# Patient Record
Sex: Male | Born: 1965
Health system: Southern US, Community
[De-identification: ages and names within clinical notes are randomized; demographics above are authoritative.]

## PROBLEM LIST (undated history)

## (undated) DIAGNOSIS — E119 Type 2 diabetes mellitus without complications: Secondary | ICD-10-CM

## (undated) DIAGNOSIS — I10 Essential (primary) hypertension: Secondary | ICD-10-CM

## (undated) HISTORY — PX: OTHER SURGICAL HISTORY: SHX169

## (undated) HISTORY — PX: APPENDECTOMY: SHX54

---

## 2017-02-08 ENCOUNTER — Ambulatory Visit (HOSPITAL_COMMUNITY)
Admission: EM | Admit: 2017-02-08 | Discharge: 2017-02-08 | Disposition: A | Discharge status: Home / Self Care | Payer: Self-pay | Attending: Internal Medicine | Admitting: Internal Medicine

## 2017-02-08 ENCOUNTER — Encounter (HOSPITAL_COMMUNITY): Payer: Self-pay | Admitting: Emergency Medicine

## 2017-02-08 DIAGNOSIS — R030 Elevated blood-pressure reading, without diagnosis of hypertension: Secondary | ICD-10-CM

## 2017-02-08 DIAGNOSIS — H9313 Tinnitus, bilateral: Secondary | ICD-10-CM

## 2017-02-08 DIAGNOSIS — R531 Weakness: Secondary | ICD-10-CM

## 2017-02-08 DIAGNOSIS — H60321 Hemorrhagic otitis externa, right ear: Secondary | ICD-10-CM

## 2017-02-08 DIAGNOSIS — R202 Paresthesia of skin: Secondary | ICD-10-CM

## 2017-02-08 HISTORY — DX: Type 2 diabetes mellitus without complications: E11.9

## 2017-02-08 HISTORY — DX: Essential (primary) hypertension: I10

## 2017-02-08 MED ORDER — CLONIDINE HCL 0.1 MG PO TABS
0.1000 mg | ORAL_TABLET | Freq: Once | ORAL | Status: AC
  Administered 2017-02-08: 0.1 mg via ORAL

## 2017-02-08 MED ORDER — CLONIDINE HCL 0.1 MG PO TABS
ORAL_TABLET | ORAL | Status: AC
  Filled 2017-02-08: qty 1

## 2017-02-08 MED ORDER — TRIAMTERENE-HCTZ 37.5-25 MG PO CAPS: 1.0000 | ORAL_CAPSULE | Freq: Every day | ORAL | 0 refills | Status: DC

## 2017-02-08 MED ORDER — NEOMYCIN-POLYMYXIN-HC 3.5-10000-1 OT SUSP: 3.0000 [drp] | OTIC | 0 refills | Status: DC

## 2017-02-08 NOTE — ED Triage Notes (Signed)
Notified dr Dayton Scrapemurray of patient complains and initial blood pressure readings

## 2017-02-08 NOTE — Discharge Instructions (Addendum)
Persistent numbness and tingling in left arm could be due to nerve compression at the shoulder or the upper spine, or the result of small stroke.  A primary care provider can help decide if advanced imaging would be helpful. Controlling your blood pressure may help prevent strokes. A prescription for triamterene/HCTZ, a blood pressure medicine, was sent to the pharmacy.  Controlling your blood pressure may also help with ringing in the ears. A prescription for Cortisporin otic, and eardrops, was sent to the pharmacy, to help with right outer ear infection. If ringing in the ears continues, or blood on the Q-tips continues, may need to follow-up with ENT.
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
## 2017-02-08 NOTE — ED Triage Notes (Signed)
Ringing in both ears for 2-3 weeks.   Left arm numbness for 4 weeks.  Numbness, tingling in left arm including left hand.  Weakness in left leg for 4 weeks too.    Patient went to acupuncture for 2 different episodes since onset

## 2017-02-15 ENCOUNTER — Ambulatory Visit: Payer: Self-pay | Attending: Internal Medicine | Admitting: Physician Assistant

## 2017-02-15 VITALS — BP 150/114 | HR 97 | Temp 98.2°F | Resp 20 | Ht 71.0 in | Wt 232.4 lb

## 2017-02-15 DIAGNOSIS — E118 Type 2 diabetes mellitus with unspecified complications: Secondary | ICD-10-CM

## 2017-02-15 DIAGNOSIS — F17210 Nicotine dependence, cigarettes, uncomplicated: Secondary | ICD-10-CM | POA: Insufficient documentation

## 2017-02-15 DIAGNOSIS — Z79899 Other long term (current) drug therapy: Secondary | ICD-10-CM | POA: Insufficient documentation

## 2017-02-15 DIAGNOSIS — Z880 Allergy status to penicillin: Secondary | ICD-10-CM | POA: Insufficient documentation

## 2017-02-15 DIAGNOSIS — R202 Paresthesia of skin: Secondary | ICD-10-CM

## 2017-02-15 DIAGNOSIS — I10 Essential (primary) hypertension: Secondary | ICD-10-CM

## 2017-02-15 DIAGNOSIS — Z7984 Long term (current) use of oral hypoglycemic drugs: Secondary | ICD-10-CM | POA: Insufficient documentation

## 2017-02-15 DIAGNOSIS — F1910 Other psychoactive substance abuse, uncomplicated: Secondary | ICD-10-CM

## 2017-02-15 LAB — POCT GLYCOSYLATED HEMOGLOBIN (HGB A1C): HEMOGLOBIN A1C: 6.2

## 2017-02-15 LAB — GLUCOSE, POCT (MANUAL RESULT ENTRY): POC GLUCOSE: 156 mg/dL — AB (ref 70–99)

## 2017-02-15 MED ORDER — METFORMIN HCL 500 MG PO TABS: 500.0000 mg | ORAL_TABLET | Freq: Two times a day (BID) | ORAL | 3 refills | Status: DC

## 2017-02-15 MED ORDER — LISINOPRIL-HYDROCHLOROTHIAZIDE 20-25 MG PO TABS: 1.0000 | ORAL_TABLET | Freq: Every day | ORAL | 3 refills | Status: DC

## 2017-02-15 MED FILL — LISINOPRIL-HCTZ 20-25 MG TA: 20-25 | 30 days supply | Qty: 30 | Fill #0

## 2017-02-15 MED FILL — ?METFORMIN HCL 500MG TABLET: 500 | 30 days supply | Qty: 60 | Fill #0

## 2017-02-15 NOTE — Progress Notes (Signed)
Patient ID: Antonio Pena, male   DOB: 09/12/1965, 51 y.o.   MRN: [id number] A  Antonio Pena, is a 51 y.o. male  [id number]  [id number]  DOB - 07/14/1965  Subjective:  Chief Complaint and HPI: Antonio Pena is a 51 y.o. male here today to establish care and for a follow up visit  after being seen in the Urgent Care 02/08/2017 c/o tinnitus and numbess and discomfort in his L arm.  BP was very high.  Clonidine was given and he was discharged with a prescription of Triamterene/HCTZ.  He was found to have otitis externa and prescribed Cortisporin otic drops.  Today, he presents with a 6 week h/o L arm/Lneck pain/paresthesias.  He does a lot of heavy lifting.  NKI.  He denies CP/SOB/Dizziness/HAs.  No weakness of L arm.  No FH early cardiac events.    His partner is with him and has been checking his blood sugars and they have been ranging from 140-270.  He denies polyuria/polydipsia.  He feels that these numbers are good because "it used to be up in the 300s."  He admits to not having been to a doctor in years.  He uses ~$2000.00/week in cocaine and has been getting opiates from friends to take for his arm pain.  The arm pain is worse at rest.  He was clean/sober in [place] many years ago for about 4 years but has never been back and has no desire to get clean again.  He is happy with his current life and drug use.     Per UC note: Blood pressure is markedly elevated, and the patient acknowledges cocaine use. He is on pain medicine. He is smoking 3 packs a day and this is a reduction from previous.  ED/Hospital notes reviewed.   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    ROS:   Constitutional:  No f/c, No night sweats, No unexplained weight loss. EENT:  No vision changes, No blurry vision, No hearing changes. No mouth, throat, or ear problems.  Respiratory: No cough, No SOB Cardiac: No CP, no palpitations GI:  No abd pain, No N/V/D. GU: No Urinary s/sx Musculoskeletal: No  joint pain Neuro: No headache, no dizziness, no motor weakness.  Skin: No rash Endocrine:  No polydipsia. No polyuria.  Psych: Denies SI/HI  No problems updated.  ALLERGIES: Allergies  Allergen Reactions  . Amoxicillin     PAST MEDICAL HISTORY: Past Medical History:  Diagnosis Date  . Diabetes mellitus without complication (HCC)   . Hypertension     MEDICATIONS AT HOME: Prior to Admission medications   Medication Sig Start Date End Date Taking? Authorizing Provider  neomycin-polymyxin-hydrocortisone (CORTISPORIN) 3.5-10000-1 OTIC suspension Place 3 drops into the right ear every 4 (four) hours. 02/08/17  Yes Eustace MooreMurray, Laura W, MD  Oxycodone-Acetaminophen (PERCOCET PO) Take by mouth.   Yes [provider]  HYDROcodone-acetaminophen (NORCO) 7.5-325 MG tablet Take 1 tablet by mouth every 6 (six) hours as needed for moderate pain.    [provider]  lisinopril-hydrochlorothiazide (PRINZIDE,ZESTORETIC) 20-25 MG tablet Take 1 tablet by mouth daily. 02/15/17   Anders SimmondsMcClung, Dotty Gonzalo M, PA-C  metFORMIN (GLUCOPHAGE) 500 MG tablet Take 1 tablet (500 mg total) by mouth 2 (two) times daily with a meal. 02/15/17   Anders SimmondsMcClung, Alanya Vukelich M, PA-C     Objective:  EXAM:   Vitals:   02/15/17 1037 02/15/17 1117  BP: (!) 157/109 (!) 150/114  Pulse: 97   Resp: 20   Temp:  98.2 F (36.8 C)   TempSrc: Oral   SpO2: 96%   Weight: 232 lb 6.4 oz (105.4 kg)   Height: 5\' 11"  (1.803 m)     General appearance : A&OX3. NAD. Non-toxic-appearing HEENT: Atraumatic and Normocephalic.  PERRLA. EOM intact.  TM clear B. Mouth-MMM, post pharynx WNL w/o erythema, No PND. Neck: supple, no JVD. No cervical lymphadenopathy. No thyromegaly Chest/Lungs:  Breathing-non-labored, Good air entry bilaterally, breath sounds normal without rales, rhonchi, or wheezing  CVS: S1 S2 regular, no murmurs, gallops, rubs  Extremities: Bilateral Lower Ext shows no edema, both legs are warm to touch with = pulse  throughout Neurology:  CN II-XII grossly intact, Non focal.   Psych:  TP linear. Normal speech. Appropriate eye contact and affect.  Skin:  No Rash  Data Review No results found for: HGBA1C   Assessment & Plan   1. Type 2 diabetes mellitus with complication, without long-term current use of insulin (HCC) New diagnosis - Glucose (CBG) - HgB A1c=6.2% today - metFORMIN (GLUCOPHAGE) 500 MG tablet; Take 1 tablet (500 mg total) by mouth 2 (two) times daily with a meal.  Dispense: 180 tablet; Refill: 3 I have had a lengthy discussion and provided education about insulin resistance and the intake of too much sugar/refined carbohydrates.  I have advised the patient to work at a goal of eliminating sugary drinks, candy, desserts, sweets, refined sugars, processed foods, and white carbohydrates.  The patient expresses understanding.    2. Hypertension, unspecified type Stop dyazide.  Given that he is also diabetic and has no desire to change lifestyle/work on diet/sobriety, etc, I will start- - lisinopril-hydrochlorothiazide (PRINZIDE,ZESTORETIC) 20-25 MG tablet; Take 1 tablet by mouth daily.  Dispense: 90 tablet; Refill: 3 _check CMP, CBC  3. Polysubstance abuse I have counseled the patient at length about substance abuse and addiction.  12 step meetings/recovery recommended.  Local 12 step meeting lists were given and attendance was encouraged.  Patient expresses understanding.    4. Paresthesia of left arm - DG Cervical Spine Complete; Future -check TSH  5. 3ppd smoker-cessation advised and resources for support offered.    I spent >50mins face to face with patient and his partner explaining the risks of continued drug use including multi-co morbidities and even death should he continue.  He states that he understands this.  Patient have been counseled extensively about nutrition and exercise  Return in about 3 weeks (around 03/08/2017) for assign PCP; f/up DM and htn.  The patient was  given clear instructions to go to ER or return to medical center if symptoms don't improve, worsen or new problems develop. The patient verbalized understanding. The patient was told to call to get lab results if they haven't heard anything in the next week.     Georgian Co, PA-C [facility] and [facility] [place], [place] [phone]   02/15/2017, 11:18 AM

## 2017-02-15 NOTE — Patient Instructions (Signed)
Check blood pressure daily and record and bring to next office visit. Check fasting blood sugar daily and record and bring to next visit.

## 2017-02-15 NOTE — Progress Notes (Signed)
Pain:  Left arm- numbness 5-6 week Right leg numbness, intermittent Constant ringing in head  F/u elevated BP

## 2017-02-16 ENCOUNTER — Ambulatory Visit (HOSPITAL_COMMUNITY)
Admission: RE | Admit: 2017-02-16 | Discharge: 2017-02-16 | Disposition: A | Discharge status: Home / Self Care | Payer: Self-pay | Source: Ambulatory Visit | Attending: Physician Assistant | Admitting: Physician Assistant

## 2017-02-16 ENCOUNTER — Telehealth: Payer: Self-pay | Admitting: *Deleted

## 2017-02-16 DIAGNOSIS — M47812 Spondylosis without myelopathy or radiculopathy, cervical region: Secondary | ICD-10-CM | POA: Insufficient documentation

## 2017-02-16 DIAGNOSIS — R202 Paresthesia of skin: Secondary | ICD-10-CM

## 2017-02-16 LAB — CBC WITH DIFFERENTIAL/PLATELET
BASOS ABS: 0 10*3/uL (ref 0.0–0.2)
Basos: 1 %
EOS (ABSOLUTE): 0.2 10*3/uL (ref 0.0–0.4)
Eos: 3 %
Hematocrit: 53.4 % — ABNORMAL HIGH (ref 37.5–51.0)
Hemoglobin: 17.9 g/dL — ABNORMAL HIGH (ref 13.0–17.7)
Immature Grans (Abs): 0 10*3/uL (ref 0.0–0.1)
Immature Granulocytes: 0 %
LYMPHS ABS: 2.7 10*3/uL (ref 0.7–3.1)
Lymphs: 37 %
MCH: 30.4 pg (ref 26.6–33.0)
MCHC: 33.5 g/dL (ref 31.5–35.7)
MCV: 91 fL (ref 79–97)
Monocytes Absolute: 0.6 10*3/uL (ref 0.1–0.9)
Monocytes: 8 %
NEUTROS ABS: 3.7 10*3/uL (ref 1.4–7.0)
Neutrophils: 51 %
PLATELETS: 255 10*3/uL (ref 150–379)
RBC: 5.89 x10E6/uL — AB (ref 4.14–5.80)
RDW: 13.6 % (ref 12.3–15.4)
WBC: 7.2 10*3/uL (ref 3.4–10.8)

## 2017-02-16 LAB — COMPREHENSIVE METABOLIC PANEL
ALBUMIN: 4.6 g/dL (ref 3.5–5.5)
ALK PHOS: 130 IU/L — AB (ref 39–117)
ALT: 37 IU/L (ref 0–44)
AST: 18 IU/L (ref 0–40)
Albumin/Globulin Ratio: 1.7 (ref 1.2–2.2)
BILIRUBIN TOTAL: 0.3 mg/dL (ref 0.0–1.2)
BUN / CREAT RATIO: 18 (ref 9–20)
BUN: 18 mg/dL (ref 6–24)
CHLORIDE: 97 mmol/L (ref 96–106)
CO2: 26 mmol/L (ref 20–29)
Calcium: 9.9 mg/dL (ref 8.7–10.2)
Creatinine, Ser: 1.01 mg/dL (ref 0.76–1.27)
GFR calc non Af Amer: 86 mL/min/{1.73_m2} (ref >59)
GFR, EST AFRICAN AMERICAN: 100 mL/min/{1.73_m2} (ref >59)
GLUCOSE: 138 mg/dL — AB (ref 65–99)
Globulin, Total: 2.7 g/dL (ref 1.5–4.5)
POTASSIUM: 5.6 mmol/L — AB (ref 3.5–5.2)
Sodium: 137 mmol/L (ref 134–144)
TOTAL PROTEIN: 7.3 g/dL (ref 6.0–8.5)

## 2017-02-16 LAB — TSH: TSH: 2.25 u[IU]/mL (ref 0.450–4.500)

## 2017-02-16 NOTE — Telephone Encounter (Signed)
Status:  Final result  Visible to patient:  No (Not Released)  Dx:  Paresthesia of left arm  Notes recorded by Anders SimmondsMcClung, Angela M, PA-C on 02/16/2017 at 1:43 PM EDT Please call patient and let him know that his xray showed some arthritis and disc narrowing but no dangerous problems. I suspect his pain will improve with rest and no heavy lifting for a bit. Follow-up as planned.   No answer.

## 2017-02-22 NOTE — Telephone Encounter (Signed)
Pt aware of results. He states pain has not improved. He attempted to get a handle on pain medication in "his way" with street medication. He c/o pain in back and shoulder. He has an appointment on Wednesday 02/24/17. Encouraged to request further imaging and referral to pain specialist.

## 2017-02-24 ENCOUNTER — Ambulatory Visit: Payer: Self-pay | Attending: Internal Medicine | Admitting: Physician Assistant

## 2017-02-24 ENCOUNTER — Other Ambulatory Visit: Payer: Self-pay

## 2017-02-24 ENCOUNTER — Encounter: Payer: Self-pay | Admitting: Physician Assistant

## 2017-02-24 VITALS — BP 141/91 | HR 88 | Temp 98.1°F | Ht 71.0 in | Wt 228.6 lb

## 2017-02-24 DIAGNOSIS — F1410 Cocaine abuse, uncomplicated: Secondary | ICD-10-CM | POA: Insufficient documentation

## 2017-02-24 DIAGNOSIS — R079 Chest pain, unspecified: Secondary | ICD-10-CM

## 2017-02-24 DIAGNOSIS — F17210 Nicotine dependence, cigarettes, uncomplicated: Secondary | ICD-10-CM | POA: Insufficient documentation

## 2017-02-24 DIAGNOSIS — F1910 Other psychoactive substance abuse, uncomplicated: Secondary | ICD-10-CM

## 2017-02-24 DIAGNOSIS — M79602 Pain in left arm: Secondary | ICD-10-CM

## 2017-02-24 DIAGNOSIS — I10 Essential (primary) hypertension: Secondary | ICD-10-CM

## 2017-02-24 DIAGNOSIS — Z880 Allergy status to penicillin: Secondary | ICD-10-CM | POA: Insufficient documentation

## 2017-02-24 DIAGNOSIS — E118 Type 2 diabetes mellitus with unspecified complications: Secondary | ICD-10-CM

## 2017-02-24 DIAGNOSIS — Z7984 Long term (current) use of oral hypoglycemic drugs: Secondary | ICD-10-CM | POA: Insufficient documentation

## 2017-02-24 DIAGNOSIS — F1110 Opioid abuse, uncomplicated: Secondary | ICD-10-CM | POA: Insufficient documentation

## 2017-02-24 LAB — GLUCOSE, POCT (MANUAL RESULT ENTRY): POC GLUCOSE: 153 mg/dL — AB (ref 70–99)

## 2017-02-24 MED ORDER — DICLOFENAC SODIUM 1 % TD GEL: 4.0000 g | Freq: Four times a day (QID) | TRANSDERMAL | 4 refills | Status: DC

## 2017-02-24 NOTE — Progress Notes (Signed)
Antonio Pena, is a 51 y.o. male  NWG:[id number]  [id number]:[id number]  DOB - July 24, 1965  Subjective:  Chief Complaint and HPI: Antonio Pena is a 51 y.o. male here with continued L arm pain X 6 weeks. (see last visit) Xrays showed arthritic changes with no impingement.  His whole arm hurts.  He continues to take opiates-about 4-6 oxycodone and 6-8 hydrocodone daily that he is buying off of the streets.  He also continues to do ~$2000.00 cocaine/week.  He is requesting something for pain management.  No weakness.  No atrophy.  No change in grip.  He is R hand dominant.  He also admits to occasional CP at work.  He usu sits down and smokes a couple of cigarettes when it occurs and this seems to help. New onset of ED.  Difficult to maintain an erection.  ROS:   Constitutional:  No f/c, No night sweats, No unexplained weight loss. EENT:  No vision changes, No blurry vision, No hearing changes. No mouth, throat, or ear problems.  Respiratory: No cough, No SOB Cardiac: + CP, no palpitations GI:  No abd pain, No N/V/D. GU: No Urinary s/sx Musculoskeletal: L arm pain Neuro: No headache, no dizziness, no motor weakness.  Skin: No rash Endocrine:  No polydipsia. No polyuria.  Psych: Denies SI/HI  Problem  Htn (Hypertension)  Substance Abuse    ALLERGIES: Allergies  Allergen Reactions  . Amoxicillin     PAST MEDICAL HISTORY: Past Medical History:  Diagnosis Date  . Diabetes mellitus without complication (HCC)   . Hypertension     MEDICATIONS AT HOME: Prior to Admission medications   Medication Sig Start Date End Date Taking? Authorizing Provider  diclofenac sodium (VOLTAREN) 1 % GEL Apply 4 g topically 4 (four) times daily. 02/24/17  Yes Anders Simmonds, PA-C  HYDROcodone-acetaminophen (NORCO) 7.5-325 MG tablet Take 1 tablet by mouth every 6 (six) hours as needed for moderate pain.   Yes [provider]  lisinopril-hydrochlorothiazide (PRINZIDE,ZESTORETIC) 20-25 MG  tablet Take 1 tablet by mouth daily. 02/15/17  Yes Georgian Co M, PA-C  metFORMIN (GLUCOPHAGE) 500 MG tablet Take 1 tablet (500 mg total) by mouth 2 (two) times daily with a meal. 02/15/17  Yes McClung, Angela M, PA-C  neomycin-polymyxin-hydrocortisone (CORTISPORIN) 3.5-10000-1 OTIC suspension Place 3 drops into the right ear every 4 (four) hours. 02/08/17  Yes Eustace Moore, MD  Oxycodone-Acetaminophen (PERCOCET PO) Take by mouth.   Yes [provider]     Objective:  EXAM:   Vitals:   02/24/17 0952  BP: (!) 141/91  Pulse: 88  Temp: 98.1 F (36.7 C)  TempSrc: Oral  SpO2: 97%  Weight: 228 lb 9.6 oz (103.7 kg)  Height: 5\' 11"  (1.803 m)    General appearance : A&OX3. NAD. Non-toxic-appearing HEENT: Atraumatic and Normocephalic.  PERRLA. EOM intact. Neck: supple, no JVD. No cervical lymphadenopathy. No thyromegaly Chest/Lungs:  Breathing-non-labored, Good air entry bilaterally, breath sounds normal without rales, rhonchi, or wheezing  CVS: S1 S2 regular, no murmurs, gallops, rubs  Extremities: Bilateral Lower Ext shows no edema, both legs are warm to touch with = pulse throughout. UE full S&ROM B with normal grip/bicep/tricep/forearm strength.  No muscle atrophy.  DTR=B Neurology:  CN II-XII grossly intact, Non focal.   Psych:  TP linear. J/I WNL. Normal speech. Appropriate eye contact and affect.  Skin:  No Rash  Data Review Lab Results  Component Value Date   HGBA1C 6.2 02/15/2017   EKG w/o ischemia and  reviewed with Dr. Hyman Hopes.    Assessment & Plan   1. Chest pain, unspecified type He has so many dangerous risk factors.  Cocaine use, htn, DM, heavy smoking that it is imperative he quit using substances.  I have implored with him at length the risks assc with continued use, smoking and comorbidities including, CVA event, death, etc.   2. Type 2 diabetes mellitus with complication, without long-term current use of insulin (HCC) Improving.  Continue metformin -  Glucose (CBG)  3. Left arm pain - diclofenac sodium (VOLTAREN) 1 % GEL; Apply 4 g topically 4 (four) times daily.  Dispense: 100 g; Refill: 4  4. Hypertension, unspecified type Improving.  Continue current medication regimen. Cocaine and smoking cessation imperative.  5. Substance abuse I have counseled the patient at length about substance abuse and addiction.  12 step meetings/recovery recommended.  Local 12 step meeting lists were given and attendance was encouraged.  Patient expresses understanding. I have offered to help assist him getting into treatment but he is not interested.    Patient have been counseled extensively about nutrition and exercise  Return in about 3 weeks (around 03/18/2017) for keep appt with Dr Laural Benes to establish care.  The patient was given clear instructions to go to ER or return to medical center if symptoms don't improve, worsen or new problems develop. The patient verbalized understanding. The patient was told to call to get lab results if they haven't heard anything in the next week.     Georgian Co, PA-C [facility] and [facility] [place], [place] [phone]   02/24/2017, 10:10 AM

## 2017-03-18 ENCOUNTER — Encounter: Payer: Self-pay | Admitting: Internal Medicine

## 2017-03-18 ENCOUNTER — Ambulatory Visit: Payer: Self-pay | Attending: Internal Medicine | Admitting: Internal Medicine

## 2017-03-18 VITALS — BP 112/72 | HR 99 | Temp 98.5°F | Resp 18 | Ht 71.0 in | Wt 232.4 lb

## 2017-03-18 DIAGNOSIS — F1110 Opioid abuse, uncomplicated: Secondary | ICD-10-CM | POA: Insufficient documentation

## 2017-03-18 DIAGNOSIS — F1410 Cocaine abuse, uncomplicated: Secondary | ICD-10-CM | POA: Insufficient documentation

## 2017-03-18 DIAGNOSIS — R200 Anesthesia of skin: Secondary | ICD-10-CM | POA: Insufficient documentation

## 2017-03-18 DIAGNOSIS — F17200 Nicotine dependence, unspecified, uncomplicated: Secondary | ICD-10-CM

## 2017-03-18 DIAGNOSIS — E119 Type 2 diabetes mellitus without complications: Secondary | ICD-10-CM | POA: Insufficient documentation

## 2017-03-18 DIAGNOSIS — Z79899 Other long term (current) drug therapy: Secondary | ICD-10-CM | POA: Insufficient documentation

## 2017-03-18 DIAGNOSIS — I10 Essential (primary) hypertension: Secondary | ICD-10-CM | POA: Insufficient documentation

## 2017-03-18 DIAGNOSIS — M542 Cervicalgia: Secondary | ICD-10-CM

## 2017-03-18 DIAGNOSIS — Z880 Allergy status to penicillin: Secondary | ICD-10-CM | POA: Insufficient documentation

## 2017-03-18 DIAGNOSIS — N529 Male erectile dysfunction, unspecified: Secondary | ICD-10-CM | POA: Insufficient documentation

## 2017-03-18 DIAGNOSIS — F1910 Other psychoactive substance abuse, uncomplicated: Secondary | ICD-10-CM

## 2017-03-18 DIAGNOSIS — Z2821 Immunization not carried out because of patient refusal: Secondary | ICD-10-CM

## 2017-03-18 DIAGNOSIS — Z7984 Long term (current) use of oral hypoglycemic drugs: Secondary | ICD-10-CM | POA: Insufficient documentation

## 2017-03-18 DIAGNOSIS — M5412 Radiculopathy, cervical region: Secondary | ICD-10-CM | POA: Insufficient documentation

## 2017-03-18 DIAGNOSIS — E118 Type 2 diabetes mellitus with unspecified complications: Secondary | ICD-10-CM

## 2017-03-18 DIAGNOSIS — F17210 Nicotine dependence, cigarettes, uncomplicated: Secondary | ICD-10-CM | POA: Insufficient documentation

## 2017-03-18 LAB — GLUCOSE, POCT (MANUAL RESULT ENTRY): POC Glucose: 134 mg/dl — AB (ref 70–99)

## 2017-03-18 MED ORDER — GABAPENTIN 300 MG PO CAPS: ORAL_CAPSULE | ORAL | 1 refills | Status: DC

## 2017-03-18 MED FILL — LISINOPRIL-HCTZ 20-25 MG TA: 20-25 | 30 days supply | Qty: 30 | Fill #1

## 2017-03-18 MED FILL — VOLTAREN 1% GEL: 1 | 6 days supply | Qty: 100 | Fill #0

## 2017-03-18 MED FILL — GABAPENTIN 300 MG CAPSULE: 300 | 30 days supply | Qty: 60 | Fill #0

## 2017-03-18 MED FILL — metFORMIN HCL 500 MG TABS: 500 | 30 days supply | Qty: 60 | Fill #1
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
## 2017-03-18 NOTE — Patient Instructions (Signed)
Start Gabapentin 300 mg at bedtime for 1 week then increase to twice a day.  This medication can cause drowsiness.

## 2017-03-19 ENCOUNTER — Other Ambulatory Visit: Payer: Self-pay | Admitting: Internal Medicine

## 2017-03-19 LAB — LYME AB/WESTERN BLOT REFLEX
LYME DISEASE AB, QUANT, IGM: <0.8 index (ref 0.00–0.79)
Lyme IgG/IgM Ab: <0.91 {ISR} (ref 0.00–0.90)

## 2017-03-19 LAB — CREATININE, SERUM
Creatinine, Ser: 1.13 mg/dL (ref 0.76–1.27)
GFR calc non Af Amer: 75 mL/min/{1.73_m2} (ref >59)
GFR, EST AFRICAN AMERICAN: 87 mL/min/{1.73_m2} (ref >59)

## 2017-03-19 LAB — SEDIMENTATION RATE: Sed Rate: 6 mm/hr (ref 0–30)

## 2017-03-19 LAB — POTASSIUM: POTASSIUM: 5.3 mmol/L — AB (ref 3.5–5.2)

## 2017-03-19 LAB — VITAMIN B12: VITAMIN B 12: 306 pg/mL (ref 232–1245)

## 2017-03-19 MED ORDER — HYDROCHLOROTHIAZIDE 12.5 MG PO CAPS: 12.5000 mg | ORAL_CAPSULE | Freq: Every day | ORAL | 1 refills | Status: DC

## 2017-03-19 MED ORDER — AMLODIPINE BESYLATE 5 MG PO TABS: 5.0000 mg | ORAL_TABLET | Freq: Every day | ORAL | 3 refills | Status: DC

## 2017-03-23 ENCOUNTER — Telehealth: Payer: Self-pay

## 2017-03-23 NOTE — Telephone Encounter (Signed)
Contacted pt to go over lab results pt is aware and doesn't have any questions or concerns 

## 2017-03-24 ENCOUNTER — Other Ambulatory Visit: Payer: Self-pay

## 2017-03-24 MED ORDER — AMLODIPINE BESYLATE 5 MG PO TABS: 5.0000 mg | ORAL_TABLET | Freq: Every day | ORAL | 3 refills | Status: DC

## 2017-03-24 MED ORDER — HYDROCHLOROTHIAZIDE 12.5 MG PO CAPS: 12.5000 mg | ORAL_CAPSULE | Freq: Every day | ORAL | 1 refills | Status: DC

## 2017-03-29 ENCOUNTER — Ambulatory Visit (HOSPITAL_COMMUNITY): Admission: RE | Admit: 2017-03-29 | Payer: Self-pay | Source: Ambulatory Visit

## 2017-04-12 ENCOUNTER — Encounter: Payer: Self-pay | Admitting: Neurology

## 2017-04-12 ENCOUNTER — Other Ambulatory Visit: Payer: Self-pay | Admitting: *Deleted

## 2017-04-12 DIAGNOSIS — R200 Anesthesia of skin: Secondary | ICD-10-CM

## 2017-04-12 DIAGNOSIS — R202 Paresthesia of skin: Principal | ICD-10-CM

## 2017-04-16 ENCOUNTER — Encounter: Payer: Self-pay | Admitting: Internal Medicine

## 2017-04-16 ENCOUNTER — Ambulatory Visit: Payer: Self-pay | Attending: Internal Medicine | Admitting: Internal Medicine

## 2017-04-16 VITALS — BP 124/85 | HR 94 | Temp 98.3°F | Resp 16 | Wt 225.6 lb

## 2017-04-16 DIAGNOSIS — E875 Hyperkalemia: Secondary | ICD-10-CM | POA: Insufficient documentation

## 2017-04-16 DIAGNOSIS — Z79891 Long term (current) use of opiate analgesic: Secondary | ICD-10-CM | POA: Insufficient documentation

## 2017-04-16 DIAGNOSIS — Z880 Allergy status to penicillin: Secondary | ICD-10-CM | POA: Insufficient documentation

## 2017-04-16 DIAGNOSIS — Z9889 Other specified postprocedural states: Secondary | ICD-10-CM | POA: Insufficient documentation

## 2017-04-16 DIAGNOSIS — F1410 Cocaine abuse, uncomplicated: Secondary | ICD-10-CM | POA: Insufficient documentation

## 2017-04-16 DIAGNOSIS — M5412 Radiculopathy, cervical region: Secondary | ICD-10-CM | POA: Insufficient documentation

## 2017-04-16 DIAGNOSIS — I10 Essential (primary) hypertension: Secondary | ICD-10-CM | POA: Insufficient documentation

## 2017-04-16 DIAGNOSIS — F17210 Nicotine dependence, cigarettes, uncomplicated: Secondary | ICD-10-CM | POA: Insufficient documentation

## 2017-04-16 DIAGNOSIS — Z7984 Long term (current) use of oral hypoglycemic drugs: Secondary | ICD-10-CM | POA: Insufficient documentation

## 2017-04-16 DIAGNOSIS — Z888 Allergy status to other drugs, medicaments and biological substances status: Secondary | ICD-10-CM | POA: Insufficient documentation

## 2017-04-16 DIAGNOSIS — F1910 Other psychoactive substance abuse, uncomplicated: Secondary | ICD-10-CM | POA: Insufficient documentation

## 2017-04-16 DIAGNOSIS — E118 Type 2 diabetes mellitus with unspecified complications: Secondary | ICD-10-CM | POA: Insufficient documentation

## 2017-04-16 DIAGNOSIS — Z79899 Other long term (current) drug therapy: Secondary | ICD-10-CM | POA: Insufficient documentation

## 2017-04-16 DIAGNOSIS — F1110 Opioid abuse, uncomplicated: Secondary | ICD-10-CM | POA: Insufficient documentation

## 2017-04-16 LAB — GLUCOSE, POCT (MANUAL RESULT ENTRY): POC GLUCOSE: 172 mg/dL — AB (ref 70–99)

## 2017-04-16 MED ORDER — GABAPENTIN 300 MG PO CAPS: ORAL_CAPSULE | ORAL | 1 refills | Status: DC

## 2017-04-16 MED ORDER — METFORMIN HCL 500 MG PO TABS: 500.0000 mg | ORAL_TABLET | Freq: Two times a day (BID) | ORAL | 3 refills | Status: DC

## 2017-04-16 MED ORDER — VITAMIN B-12 1000 MCG PO TABS: 1000.0000 ug | ORAL_TABLET | Freq: Every day | ORAL | 1 refills | Status: AC

## 2017-04-16 MED ORDER — AMLODIPINE BESYLATE 5 MG PO TABS: 5.0000 mg | ORAL_TABLET | Freq: Every day | ORAL | 3 refills | Status: DC

## 2017-04-16 MED FILL — GABAPENTIN 300 MG CAPSULE: 300 | 30 days supply | Qty: 60 | Fill #0

## 2017-04-16 MED FILL — ?METFORMIN HCL 500MG TABLET: 500 | 30 days supply | Qty: 60 | Fill #0
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
## 2017-04-16 NOTE — Patient Instructions (Signed)
Take the Norvasc 5 mg daily for blood pressure. Continue Metformin for diabetes.  Take Vitamin B12 for low level.

## 2017-04-20 ENCOUNTER — Encounter: Payer: Self-pay | Admitting: Neurology

## 2017-04-23 ENCOUNTER — Other Ambulatory Visit: Payer: Self-pay | Admitting: *Deleted

## 2017-04-23 DIAGNOSIS — R200 Anesthesia of skin: Secondary | ICD-10-CM

## 2017-05-04 ENCOUNTER — Encounter: Payer: Self-pay | Admitting: Neurology

## 2017-05-20 ENCOUNTER — Ambulatory Visit (HOSPITAL_COMMUNITY): Payer: Self-pay

## 2017-06-07 MED FILL — AMLODIPINE BESYLATE 5 MG TA: 5 | 30 days supply | Qty: 30 | Fill #0

## 2017-06-07 MED FILL — ?METFORMIN HCL 500MG TABLET: 500 | 30 days supply | Qty: 60 | Fill #1

## 2017-06-07 MED FILL — HYDROCHLOROTHIAZIDE 12.5 MG: 12.5 | 30 days supply | Qty: 30 | Fill #0

## 2017-06-10 ENCOUNTER — Ambulatory Visit (HOSPITAL_COMMUNITY): Payer: Self-pay

## 2017-07-13 ENCOUNTER — Ambulatory Visit (HOSPITAL_COMMUNITY)
Admission: RE | Admit: 2017-07-13 | Discharge: 2017-07-13 | Disposition: A | Discharge status: Home / Self Care | Payer: Self-pay | Source: Ambulatory Visit | Attending: Internal Medicine | Admitting: Internal Medicine

## 2017-07-13 DIAGNOSIS — M542 Cervicalgia: Secondary | ICD-10-CM

## 2017-07-20 ENCOUNTER — Encounter (HOSPITAL_COMMUNITY): Payer: Self-pay

## 2017-07-20 ENCOUNTER — Ambulatory Visit (HOSPITAL_COMMUNITY)
Admission: RE | Admit: 2017-07-20 | Discharge: 2017-07-20 | Disposition: A | Discharge status: Home / Self Care | Payer: Self-pay | Source: Ambulatory Visit | Attending: Internal Medicine | Admitting: Internal Medicine

## 2017-07-20 DIAGNOSIS — S0540XA Penetrating wound of orbit with or without foreign body, unspecified eye, initial encounter: Secondary | ICD-10-CM

## 2017-07-20 DIAGNOSIS — Z0389 Encounter for observation for other suspected diseases and conditions ruled out: Secondary | ICD-10-CM | POA: Insufficient documentation

## 2017-07-20 DIAGNOSIS — M50222 Other cervical disc displacement at C5-C6 level: Secondary | ICD-10-CM | POA: Insufficient documentation

## 2017-07-20 DIAGNOSIS — M542 Cervicalgia: Secondary | ICD-10-CM | POA: Insufficient documentation

## 2017-07-20 DIAGNOSIS — M4802 Spinal stenosis, cervical region: Secondary | ICD-10-CM | POA: Insufficient documentation

## 2017-07-20 LAB — CREATININE, SERUM: Creatinine, Ser: 0.98 mg/dL (ref 0.61–1.24)

## 2017-07-20 MED ORDER — GADOBENATE DIMEGLUMINE 529 MG/ML IV SOLN
20.0000 mL | Freq: Once | INTRAVENOUS | Status: AC | PRN
  Administered 2017-07-20: 20 mL via INTRAVENOUS

## 2017-07-21 ENCOUNTER — Other Ambulatory Visit: Payer: Self-pay | Admitting: Family Medicine

## 2017-07-21 ENCOUNTER — Telehealth: Payer: Self-pay | Admitting: Internal Medicine

## 2017-07-21 DIAGNOSIS — M5412 Radiculopathy, cervical region: Secondary | ICD-10-CM

## 2017-07-21 DIAGNOSIS — E118 Type 2 diabetes mellitus with unspecified complications: Secondary | ICD-10-CM

## 2017-07-21 DIAGNOSIS — I10 Essential (primary) hypertension: Secondary | ICD-10-CM

## 2017-07-21 DIAGNOSIS — G542 Cervical root disorders, not elsewhere classified: Secondary | ICD-10-CM

## 2017-07-21 MED ORDER — AMLODIPINE BESYLATE 5 MG PO TABS: 5.0000 mg | ORAL_TABLET | Freq: Every day | ORAL | 3 refills | Status: DC

## 2017-07-21 MED ORDER — GABAPENTIN 300 MG PO CAPS: ORAL_CAPSULE | ORAL | 1 refills | Status: DC

## 2017-07-21 MED ORDER — METFORMIN HCL 500 MG PO TABS: 500.0000 mg | ORAL_TABLET | Freq: Two times a day (BID) | ORAL | 3 refills | Status: DC

## 2017-07-21 NOTE — Telephone Encounter (Signed)
PC placed to pt today to g over results of MRI of C-spine.  Results copied below.  I advised pt to be seen in ER so that he can be seen by neurosurgeon.  Pt declined stating that he does not have insurance as has to pay as he goes.  MRI cost $1800 and he pay $1200 down and they will bill him for the rest. Still c/o of pain and numbness especially in LT arm.  PT informed he can loss function in one or both arms if he is not seen urgently. Pt expresses understanding but states he has been dealing with this for a while.  He plans to save up funds over the next 2-3 wks then will see a specialist. He does not want to apply for [facility] discount.  He is agreeable to me submitting referral to neurosurgeon.

## 2017-07-21 NOTE — Telephone Encounter (Signed)
Please call [phone]

## 2017-07-29 MED FILL — ?AMLODIPINE BESYLATE 5 MG T: 5 MG | 30 days supply | Qty: 30 | Fill #0

## 2017-07-29 MED FILL — ?METFORMIN HCL 500MG TABLET: 500 | 30 days supply | Qty: 60 | Fill #0

## 2017-07-29 MED FILL — GABAPENTIN 300 MG CAPSULE: 300 | 30 days supply | Qty: 60 | Fill #0

## 2017-08-11 ENCOUNTER — Telehealth: Payer: Self-pay | Admitting: Internal Medicine

## 2017-08-11 NOTE — Telephone Encounter (Signed)
-----   Message from Dionne BucyNora E Soler sent at 08/10/2017  4:03 PM EST ----- Dr Laural BenesJohnson  Patient is uninsured and we don't have Neurosurgeon with our programs  . mailed a letter with the options to go to [facility] $200 consultation or [facility]  [facility]150 waiting for the patient response . ----- Message ----- From: Marcine MatarJohnson, Tychelle Purkey B, MD Sent: 07/21/2017   1:31 PM To: Dionne BucyNora E Soler  Can you try to get him in with neurosurgeon as soon as possible but no earlier than 3 wks out?  Thanks

## 2017-08-11 NOTE — Telephone Encounter (Signed)
-----   Message from Dionne BucyNora E Soler sent at 08/10/2017  4:06 PM EST ----- Regarding: Neurosugeon  Referral  Please, disregard the previous message .   Sent Referral to [facility] ph. # [phone] .They will contact the patient to schedule an appointment and I will follow up.

## 2017-09-29 MED FILL — metFORMIN HCL 500 MG TABS: 500 | 30 days supply | Qty: 60 | Fill #1

## 2017-09-29 MED FILL — AMLODIPINE BESYLATE 5 MG TA: 5 | 30 days supply | Qty: 30 | Fill #1

## 2017-11-15 MED FILL — AMLODIPINE BESYLATE 5 MG TA: 5 | 30 days supply | Qty: 30 | Fill #2

## 2017-11-15 MED FILL — metFORMIN HCL 500 MG TABS: 500 | 30 days supply | Qty: 60 | Fill #2

## 2017-11-15 MED FILL — GABAPENTIN 300 MG CAPSULE: 300 | 30 days supply | Qty: 60 | Fill #1

## 2018-01-18 ENCOUNTER — Other Ambulatory Visit: Payer: Self-pay | Admitting: Internal Medicine

## 2018-01-18 DIAGNOSIS — I10 Essential (primary) hypertension: Secondary | ICD-10-CM

## 2018-01-18 NOTE — Telephone Encounter (Signed)
Pt's friend called to request a refill on pt's BP medications sent to [facility] pharmacy Please follow up

## 2018-01-19 MED ORDER — AMLODIPINE BESYLATE 5 MG PO TABS: 5.0000 mg | ORAL_TABLET | Freq: Every day | ORAL | 3 refills | Status: DC

## 2018-01-21 MED FILL — metFORMIN HCL 500 MG TABS: 500 | 30 days supply | Qty: 60 | Fill #3

## 2018-01-21 MED FILL — AMLODIPINE BESYLATE 5 MG TA: 5 | 30 days supply | Qty: 30 | Fill #3

## 2018-02-25 ENCOUNTER — Ambulatory Visit: Payer: Self-pay | Admitting: Internal Medicine

## 2018-03-10 ENCOUNTER — Ambulatory Visit: Payer: Self-pay | Admitting: Internal Medicine

## 2018-04-14 ENCOUNTER — Ambulatory Visit: Payer: Self-pay | Admitting: Internal Medicine

## 2018-08-09 ENCOUNTER — Encounter: Payer: Self-pay | Admitting: Internal Medicine

## 2018-08-09 ENCOUNTER — Ambulatory Visit: Payer: Self-pay | Attending: Internal Medicine | Admitting: Internal Medicine

## 2018-08-09 VITALS — BP 132/89 | HR 92 | Temp 98.5°F | Resp 16 | Ht 71.0 in | Wt 250.0 lb

## 2018-08-09 DIAGNOSIS — F1910 Other psychoactive substance abuse, uncomplicated: Secondary | ICD-10-CM

## 2018-08-09 DIAGNOSIS — Z79899 Other long term (current) drug therapy: Secondary | ICD-10-CM | POA: Insufficient documentation

## 2018-08-09 DIAGNOSIS — M5412 Radiculopathy, cervical region: Secondary | ICD-10-CM | POA: Insufficient documentation

## 2018-08-09 DIAGNOSIS — R358 Other polyuria: Secondary | ICD-10-CM | POA: Insufficient documentation

## 2018-08-09 DIAGNOSIS — Z7901 Long term (current) use of anticoagulants: Secondary | ICD-10-CM | POA: Insufficient documentation

## 2018-08-09 DIAGNOSIS — E118 Type 2 diabetes mellitus with unspecified complications: Secondary | ICD-10-CM | POA: Insufficient documentation

## 2018-08-09 DIAGNOSIS — R251 Tremor, unspecified: Secondary | ICD-10-CM | POA: Insufficient documentation

## 2018-08-09 DIAGNOSIS — Z125 Encounter for screening for malignant neoplasm of prostate: Secondary | ICD-10-CM | POA: Insufficient documentation

## 2018-08-09 DIAGNOSIS — N3943 Post-void dribbling: Secondary | ICD-10-CM | POA: Insufficient documentation

## 2018-08-09 DIAGNOSIS — F17200 Nicotine dependence, unspecified, uncomplicated: Secondary | ICD-10-CM | POA: Insufficient documentation

## 2018-08-09 DIAGNOSIS — F17210 Nicotine dependence, cigarettes, uncomplicated: Secondary | ICD-10-CM | POA: Insufficient documentation

## 2018-08-09 DIAGNOSIS — R3589 Other polyuria: Secondary | ICD-10-CM

## 2018-08-09 DIAGNOSIS — Z7984 Long term (current) use of oral hypoglycemic drugs: Secondary | ICD-10-CM | POA: Insufficient documentation

## 2018-08-09 DIAGNOSIS — Z23 Encounter for immunization: Secondary | ICD-10-CM | POA: Insufficient documentation

## 2018-08-09 DIAGNOSIS — M4802 Spinal stenosis, cervical region: Secondary | ICD-10-CM | POA: Insufficient documentation

## 2018-08-09 DIAGNOSIS — I10 Essential (primary) hypertension: Secondary | ICD-10-CM | POA: Insufficient documentation

## 2018-08-09 DIAGNOSIS — M50122 Cervical disc disorder at C5-C6 level with radiculopathy: Secondary | ICD-10-CM | POA: Insufficient documentation

## 2018-08-09 LAB — POCT GLYCOSYLATED HEMOGLOBIN (HGB A1C): HbA1c, POC (controlled diabetic range): 9.6 % — AB (ref 0.0–7.0)

## 2018-08-09 LAB — GLUCOSE, POCT (MANUAL RESULT ENTRY): POC GLUCOSE: 337 mg/dL — AB (ref 70–99)

## 2018-08-09 MED ORDER — HYDROCHLOROTHIAZIDE 12.5 MG PO TABS
12.5000 mg | ORAL_TABLET | Freq: Every day | ORAL | 3 refills | Status: DC
Start: 2018-08-09 — End: 2018-09-19

## 2018-08-09 MED ORDER — GABAPENTIN 300 MG PO CAPS: ORAL_CAPSULE | ORAL | 3 refills | Status: DC

## 2018-08-09 MED ORDER — GLIMEPIRIDE 2 MG PO TABS: 2.0000 mg | ORAL_TABLET | Freq: Every day | ORAL | 3 refills | Status: DC

## 2018-08-09 MED ORDER — AMLODIPINE BESYLATE 5 MG PO TABS: 5.0000 mg | ORAL_TABLET | Freq: Every day | ORAL | 3 refills | Status: DC

## 2018-08-09 MED ORDER — METFORMIN HCL 500 MG PO TABS: 500.0000 mg | ORAL_TABLET | Freq: Two times a day (BID) | ORAL | 3 refills | Status: DC

## 2018-08-09 MED ORDER — TAMSULOSIN HCL 0.4 MG PO CAPS: 0.4000 mg | ORAL_CAPSULE | Freq: Every day | ORAL | 3 refills | Status: DC

## 2018-08-09 MED FILL — TAMSULOSIN HCL 0.4 MG CAP: 0.4 | 30 days supply | Qty: 30 | Fill #0

## 2018-08-09 MED FILL — HYDROCHLOROTHIAZIDE 12.5 MG: 12.5 | 30 days supply | Qty: 30 | Fill #0

## 2018-08-09 MED FILL — GLIMEPIRIDE 2 MG TABS: 2 | 30 days supply | Qty: 30 | Fill #0

## 2018-08-09 MED FILL — GABAPENTIN 300 MG CAPSULE: 300 | 33 days supply | Qty: 60 | Fill #0

## 2018-08-09 MED FILL — AMLODIPINE BESYLATE 5 MG TA: 5 | 30 days supply | Qty: 30 | Fill #0

## 2018-08-09 MED FILL — metFORMIN HCL 500 MG TABS: 500 | 90 days supply | Qty: 180 | Fill #0

## 2018-08-09 NOTE — Progress Notes (Signed)
Patient ID: Antonio Pena, male    DOB: 07/15/1965  MRN: [id number]  CC: Diabetes   Subjective: Antonio PilonGerald Pena is a 53 y.o. male who presents for chronic disease management.  Last seen October 2018. His concerns today include:  Pt withhistory of HTN, diabetes type 2, cervical radiculopathy, erectile dysfunction, cocaine abuse, street opiate abuse.   Still having a lot of pain in the neck.  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  Also noted right foraminal stenosis at C2-5.  Referral to neurosurgeon was recommended at that time but patient wanted to hold off due to financial issues.  He is still uninsured.  However he states that he is willing to move forward with referral to the neurosurgeon at this time.  Patient complains of polyuria during the night and day.  Sometimes he feels that he will not make it to the restroom in time to avoid an accident.  If he is on the road he sometimes have to pull over to urinate on the side of the road.  He endorses straining at times to get his urine started and feeling of incomplete emptying after he urinates.  He also endorses postvoid dribbling.  In regards to his diabetes, he was out of metformin for 3 to 4 months until about 3 weeks ago when he was seen in the ER [facility] and I gave him a refill on metformin until he is seen today.  He is supposed to be on metformin 500 mg twice a day but he has only been taking once a day.  He endorses polyuria and polydipsia.  He has a glucometer and checks blood sugars intermittently.  He denies any blurred vision.  HTN: He was out of amlodipine for 3 to 4 months until he got a refill recently through [facility].  He denies any  chronic headaches or dizziness.  He tries to limit salt in the foods.   Absence abuse: He states that he has cut back on the frequency at which he uses cocaine and crack cocaine.  Used to use daily but now down to once every 3 to 4 weeks.  He is quit buying Vicodin and Percocets of the street completely.  He states that he has also cut back on smoking from 3 packs a day to 2 packs a day. Patient Active Problem List   Diagnosis Date Noted  . Type 2 diabetes mellitus with complication, without long-term current use of insulin (HCC) 02/24/2017  . HTN (hypertension) 02/24/2017  . Substance abuse (HCC) 02/24/2017     Current Outpatient Medications on File Prior to Visit  Medication Sig Dispense Refill  . amLODipine (NORVASC) 5 MG tablet Take 1 tablet (5 mg total) by mouth daily. 30 tablet 3  . diclofenac sodium (VOLTAREN) 1 % GEL Apply 4 g topically 4 (four) times daily. 100 g 4  . gabapentin (NEURONTIN) 300 MG capsule 1 cap daily at bedtime x 1 wk then BID 60 capsule 1  . metFORMIN (GLUCOPHAGE) 500 MG tablet Take 1 tablet (500 mg total) by mouth 2 (two) times daily with a meal. (Patient not taking: Reported on 08/09/2018) 180 tablet 3  . neomycin-polymyxin-hydrocortisone (CORTISPORIN) 3.5-10000-1  OTIC suspension Place 3 drops into the right ear every 4 (four) hours. 10 mL 0  . Oxycodone-Acetaminophen (PERCOCET PO) Take by mouth.    . vitamin B-12 (CYANOCOBALAMIN) 1000 MCG tablet Take 1 tablet (1,000 mcg total) by mouth daily. 90 tablet 1   No current facility-administered medications on file prior to visit.     Allergies  Allergen Reactions  . Amoxicillin   . Lisinopril     hyperkalemia    Social History   Socioeconomic History  . Marital status: Single    Spouse name: Not on file  . Number of children: Not on file  . Years of education: Not on file  . Highest education level: Not on file  Occupational History  . Not on file  Social Needs  . Financial resource strain: Not on file    . Food insecurity:    Worry: Not on file    Inability: Not on file  . Transportation needs:    Medical: Not on file    Non-medical: Not on file  Tobacco Use  . Smoking status: Current Every Day Smoker    Packs/day: 3.00    Types: Cigarettes  . Smokeless tobacco: Never Used  . Tobacco comment: 3 packs a day  Substance and Sexual Activity  . Alcohol use: Yes    Comment: ocasionally  . Drug use: Yes    Types: Cocaine  . Sexual activity: Not on file  Lifestyle  . Physical activity:    Days per week: Not on file    Minutes per session: Not on file  . Stress: Not on file  Relationships  . Social connections:    Talks on phone: Not on file    Gets together: Not on file    Attends religious service: Not on file    Active member of club or organization: Not on file    Attends meetings of clubs or organizations: Not on file    Relationship status: Not on file  . Intimate partner violence:    Fear of current or ex partner: Not on file    Emotionally abused: Not on file    Physically abused: Not on file    Forced sexual activity: Not on file  Other Topics Concern  . Not on file  Social History Narrative  . Not on file    No family history on file.  ROS: Review of Systems Negative except as above. PHYSICAL EXAM: BP 132/89   Pulse 92   Temp 98.5 F (36.9 C) (Oral)   Resp 16   Ht 5\' 11"  (1.803 m)   Wt 250 lb (113.4 kg)   SpO2 96%   BMI 34.87 kg/m   Physical Exam  General appearance - alert, well appearing, and in no distress Mental status - normal mood, behavior, speech, dress, motor activity, and thought processes Mouth - mucous membranes moist, pharynx normal without lesions Neck -no cervical lymphadenopathy.  No thyroid enlargement.  Chest - clear to auscultation, no wheezes, rales or rhonchi, symmetric air entry Heart - normal rate, regular rhythm, normal S1, S2, no murmurs, rubs, clicks or gallops Rectal -patient declined  neurological -cranial nerves grossly  intact.  Power in the upper extremities and lower extremities 5/5 bilaterally.  Decreased gross sensation in the right arm and hand.  Gait is normal. Leap exam is normal. Musculoskeletal -no muscle wasting in the upper arms Extremities -no lower extremity edema. Results for orders placed or performed in visit on 08/09/18  POCT glucose (  manual entry)  Result Value Ref Range   POC Glucose 337 (A) 70 - 99 mg/dl  POCT glycosylated hemoglobin (Hb A1C)  Result Value Ref Range   Hemoglobin A1C     HbA1c POC (<> result, manual entry)     HbA1c, POC (prediabetic range)     HbA1c, POC (controlled diabetic range) 9.6 (A) 0.0 - 7.0 %    ASSESSMENT AND PLAN: 1. Type 2 diabetes mellitus with complication, without long-term current use of insulin (HCC) Discussed the importance of healthy eating habits, regular aerobic exercise (at least 150 minutes a week as tolerated) and medication compliance to achieve or maintain control of diabetes. Increase metformin to twice a day and add Amaryl. Encourage him to check blood sugars once a day and bring in readings on next visit. - POCT glucose (manual entry) - POCT glycosylated hemoglobin (Hb A1C) - Microalbumin / creatinine urine ratio - metFORMIN (GLUCOPHAGE) 500 MG tablet; Take 1 tablet (500 mg total) by mouth 2 (two) times daily with a meal.  Dispense: 180 tablet; Refill: 3 - glimepiride (AMARYL) 2 MG tablet; Take 1 tablet (2 mg total) by mouth daily before breakfast.  Dispense: 30 tablet; Refill: 3 - CBC - Comprehensive metabolic panel - Lipid panel  2. Essential hypertension Not at goal.  Refill amlodipine and add low-dose hydrochlorothiazide.  DASH diet discussed and encouraged. - amLODipine (NORVASC) 5 MG tablet; Take 1 tablet (5 mg total) by mouth daily.  Dispense: 30 tablet; Refill: 3 - hydrochlorothiazide (HYDRODIURIL) 12.5 MG tablet; Take 1 tablet (12.5 mg total) by mouth daily.  Dispense: 30 tablet; Refill: 3  3. Polyuria Symptoms suggest  BPH.  Patient declined rectal exam to check the prostate.  Start Flomax - tamsulosin (FLOMAX) 0.4 MG CAPS capsule; Take 1 capsule (0.4 mg total) by mouth at bedtime.  Dispense: 30 capsule; Refill: 3 - PSA  4. Tobacco dependence Advised to quit.  Patient states that he will continue to cut down but not ready to quit completely. 3 minutes spent on counseling  5. Cervical radiculopathy - Ambulatory referral to Neurosurgery - gabapentin (NEURONTIN) 300 MG capsule; 1 cap daily at bedtime x 1 wk then BID  Dispense: 60 capsule; Refill: 3  6. Substance abuse (HCC) Recommend getting into a treatment program but patient is not interested in that.  7. Need for diphtheria-tetanus-pertussis (Tdap) vaccine Given  8. Need for 23-polyvalent pneumococcal polysaccharide vaccine Given  9. Prostate cancer screening - PSA    Patient was given the opportunity to ask questions.  Patient verbalized understanding of the plan and was able to repeat key elements of the plan.   Orders Placed This Encounter  Procedures  . Microalbumin / creatinine urine ratio  . POCT glucose (manual entry)  . POCT glycosylated hemoglobin (Hb A1C)     Requested Prescriptions    No prescriptions requested or ordered in this encounter    No follow-ups on file.  Jonah Blue, MD, FACP

## 2018-08-09 NOTE — Progress Notes (Signed)
Pt states right leg shakes a lot  Pt states right knee has no strength in it   Pt states right arm constantly throbbs  Pt states he has pain in the middle of his neck

## 2018-08-09 NOTE — Patient Instructions (Signed)

## 2018-08-10 ENCOUNTER — Other Ambulatory Visit: Payer: Self-pay | Admitting: Internal Medicine

## 2018-08-10 LAB — CBC
Hematocrit: 47.7 % (ref 37.5–51.0)
Hemoglobin: 16 g/dL (ref 13.0–17.7)
MCH: 29.8 pg (ref 26.6–33.0)
MCHC: 33.5 g/dL (ref 31.5–35.7)
MCV: 89 fL (ref 79–97)
PLATELETS: 267 10*3/uL (ref 150–450)
RBC: 5.37 x10E6/uL (ref 4.14–5.80)
RDW: 12.5 % (ref 11.6–15.4)
WBC: 7.5 10*3/uL (ref 3.4–10.8)

## 2018-08-10 LAB — COMPREHENSIVE METABOLIC PANEL
ALT: 35 IU/L (ref 0–44)
AST: 18 IU/L (ref 0–40)
Albumin/Globulin Ratio: 2 (ref 1.2–2.2)
Albumin: 4.5 g/dL (ref 3.8–4.9)
Alkaline Phosphatase: 146 IU/L — ABNORMAL HIGH (ref 39–117)
BUN/Creatinine Ratio: 12 (ref 9–20)
BUN: 12 mg/dL (ref 6–24)
Bilirubin Total: 0.6 mg/dL (ref 0.0–1.2)
CALCIUM: 9.6 mg/dL (ref 8.7–10.2)
CO2: 23 mmol/L (ref 20–29)
Chloride: 97 mmol/L (ref 96–106)
Creatinine, Ser: 1.03 mg/dL (ref 0.76–1.27)
GFR calc Af Amer: 96 mL/min/{1.73_m2} (ref >59)
GFR, EST NON AFRICAN AMERICAN: 83 mL/min/{1.73_m2} (ref >59)
GLUCOSE: 303 mg/dL — AB (ref 65–99)
Globulin, Total: 2.3 g/dL (ref 1.5–4.5)
Potassium: 4.5 mmol/L (ref 3.5–5.2)
Sodium: 138 mmol/L (ref 134–144)
TOTAL PROTEIN: 6.8 g/dL (ref 6.0–8.5)

## 2018-08-10 LAB — MICROALBUMIN / CREATININE URINE RATIO
CREATININE, UR: 114 mg/dL
MICROALB/CREAT RATIO: 13 mg/g{creat} (ref 0–29)
MICROALBUM., U, RANDOM: 15.2 ug/mL

## 2018-08-10 LAB — LIPID PANEL
CHOL/HDL RATIO: 6.4 ratio — AB (ref 0.0–5.0)
Cholesterol, Total: 178 mg/dL (ref 100–199)
HDL: 28 mg/dL — AB (ref >39)
LDL Calculated: 99 mg/dL (ref 0–99)
TRIGLYCERIDES: 254 mg/dL — AB (ref 0–149)
VLDL CHOLESTEROL CAL: 51 mg/dL — AB (ref 5–40)

## 2018-08-10 LAB — PSA: PROSTATE SPECIFIC AG, SERUM: 0.4 ng/mL (ref 0.0–4.0)

## 2018-08-10 MED ORDER — ATORVASTATIN CALCIUM 10 MG PO TABS: 10.0000 mg | ORAL_TABLET | Freq: Every day | ORAL | 3 refills | Status: DC

## 2018-08-11 ENCOUNTER — Telehealth: Payer: Self-pay

## 2018-08-11 NOTE — Telephone Encounter (Signed)
Contacted pt to go over lab results pt didn't answer left a detailed vm informing pt of results and if he has any questions or concerns to give me a call  

## 2018-08-11 NOTE — Telephone Encounter (Signed)
Pt returned call to office for lab results pt doesn't have any questions or concerns

## 2018-08-17 MED FILL — ATORVASTATIN 10 MG TABLET: 10 | 30 days supply | Qty: 30 | Fill #0

## 2018-09-19 ENCOUNTER — Encounter: Payer: Self-pay | Admitting: Internal Medicine

## 2018-09-19 ENCOUNTER — Ambulatory Visit: Payer: Self-pay | Attending: Internal Medicine | Admitting: Internal Medicine

## 2018-09-19 ENCOUNTER — Other Ambulatory Visit: Payer: Self-pay

## 2018-09-19 VITALS — BP 154/104 | HR 74 | Temp 98.3°F | Resp 16 | Wt 251.6 lb

## 2018-09-19 DIAGNOSIS — F17200 Nicotine dependence, unspecified, uncomplicated: Secondary | ICD-10-CM

## 2018-09-19 DIAGNOSIS — IMO0001 Reserved for inherently not codable concepts without codable children: Secondary | ICD-10-CM

## 2018-09-19 DIAGNOSIS — N401 Enlarged prostate with lower urinary tract symptoms: Secondary | ICD-10-CM | POA: Insufficient documentation

## 2018-09-19 DIAGNOSIS — F17210 Nicotine dependence, cigarettes, uncomplicated: Secondary | ICD-10-CM

## 2018-09-19 DIAGNOSIS — I10 Essential (primary) hypertension: Secondary | ICD-10-CM

## 2018-09-19 DIAGNOSIS — M170 Bilateral primary osteoarthritis of knee: Secondary | ICD-10-CM | POA: Insufficient documentation

## 2018-09-19 DIAGNOSIS — M5412 Radiculopathy, cervical region: Secondary | ICD-10-CM

## 2018-09-19 DIAGNOSIS — Z23 Encounter for immunization: Secondary | ICD-10-CM

## 2018-09-19 DIAGNOSIS — R350 Frequency of micturition: Secondary | ICD-10-CM

## 2018-09-19 DIAGNOSIS — E1165 Type 2 diabetes mellitus with hyperglycemia: Secondary | ICD-10-CM

## 2018-09-19 DIAGNOSIS — E7800 Pure hypercholesterolemia, unspecified: Secondary | ICD-10-CM

## 2018-09-19 LAB — GLUCOSE, POCT (MANUAL RESULT ENTRY): POC GLUCOSE: 230 mg/dL — AB (ref 70–99)

## 2018-09-19 MED ORDER — ATORVASTATIN CALCIUM 10 MG PO TABS: 10.0000 mg | ORAL_TABLET | Freq: Every day | ORAL | 3 refills | Status: DC

## 2018-09-19 MED ORDER — GLIMEPIRIDE 4 MG PO TABS: 4.0000 mg | ORAL_TABLET | Freq: Every day | ORAL | 3 refills | Status: DC

## 2018-09-19 MED ORDER — DICLOFENAC SODIUM 1 % TD GEL: 4.0000 g | Freq: Four times a day (QID) | TRANSDERMAL | 4 refills | Status: DC

## 2018-09-19 MED ORDER — METFORMIN HCL 1000 MG PO TABS: 1000.0000 mg | ORAL_TABLET | Freq: Two times a day (BID) | ORAL | 3 refills | Status: DC

## 2018-09-19 MED ORDER — HYDROCHLOROTHIAZIDE 12.5 MG PO TABS: 12.5000 mg | ORAL_TABLET | Freq: Every day | ORAL | 3 refills | Status: DC

## 2018-09-19 MED ORDER — TAMSULOSIN HCL 0.4 MG PO CAPS
0.4000 mg | ORAL_CAPSULE | Freq: Every day | ORAL | 3 refills | Status: DC
Start: 2018-09-19 — End: 2019-11-22

## 2018-09-19 MED ORDER — AMLODIPINE BESYLATE 5 MG PO TABS: 5.0000 mg | ORAL_TABLET | Freq: Every day | ORAL | 3 refills | Status: DC

## 2018-09-19 MED FILL — AMLODIPINE BESYLATE 5 MG TA: 5 | 30 days supply | Qty: 30 | Fill #0

## 2018-09-19 MED FILL — metFORMIN HCL 1000 MG TABS: 1000 | 30 days supply | Qty: 60 | Fill #0

## 2018-09-19 MED FILL — DICLOFENAC SODIUM 1% GEL: 1 | 6 days supply | Qty: 100 | Fill #0

## 2018-09-19 MED FILL — GLIMEPIRIDE 4 MG TABS: 4 | 30 days supply | Qty: 30 | Fill #0

## 2018-09-19 MED FILL — TAMSULOSIN HCL 0.4 MG CAP: 0.4 | 90 days supply | Qty: 90 | Fill #0

## 2018-09-19 MED FILL — HYDROCHLOROTHIAZIDE 12.5 MG: 12.5 | 30 days supply | Qty: 30 | Fill #0

## 2018-09-19 MED FILL — ATORVASTATIN 10 MG TABLET: 10 | 30 days supply | Qty: 30 | Fill #0

## 2018-09-19 NOTE — Progress Notes (Signed)
Patient ID: Antonio Pena, male    DOB: [birth year]  MRN: [id number]  CC: Follow-up on diabetes, HTN, BPH, tob dep, cervical radiculopathy  Subjective: Antonio Pena is a 53 y.o. male who presents for 5 weeks follow-up. His concerns today include:  Pt withhistory of HTN, diabetes type 2,cervical radiculopathy,erectile dysfunction, cocaine abuse, street opiate abuse.  BPH: Patient started on Flomax on last visit with me due to symptoms suggestive of BPH.  He had declined rectal exam.  Reports that symptoms are much better on the Flomax.   DM: Checks blood sugars twice a day before breakfast and before dinner.  He brings in a log.  Most of his blood sugars are in the 200-300 range.  Blood sugar this morning is 230 fasting.  Did get Metformin and Amaryl as prescribed on last visit. Out x 1 wk and has not made it back to pharmacy until today due to work schedule. Eating more fruits - oranges, banana, blue berries and strawberries every day Using Sweet and Low in coffee and changed to diet sodas -He did receive his lab results informing him that his LDL is not at goal.  He did pick up the prescription for the atorvastatin and is tolerating it.  C/o stiffness and aching in knees first thing in the mornings and when he gets up from prolong sitting.  Stiffness and aching lasts for about 20 minutes until he is up and moving around more.  No swelling in the knees.  He is very active during the day with his work.  HTN:  Out of BP meds for past 7-8 days.  He has been checking blood pressure several times a week and has log with him.  Some of his readings are 128/96, 160/93, 126/80, 126/73. "I love country ham, fat back and bacon."  Tobacco dependence: Reports that he is now down to 1-1/2 packs a day from 2 packs.  He had started at 3 packs a day.  Reports that he is spending more time with his girlfriend who lives in [place] and he cannot smoke in her house so this has been an incentive for him to  cut back even more.  Cervical radiculopathy: Patient is uninsured and is not interested in applying for the orange card or the cone discount card.  He was sent a letter by our referral coordinator informing him of his choices.  He can be seen by [facility] for consultation with an out-of-pocket fee of $200.  Patient states he is willing to pay that. Patient Active Problem List   Diagnosis Date Noted  . Tobacco dependence 08/09/2018  . Cervical radiculopathy 08/09/2018  . Type 2 diabetes mellitus with complication, without long-term current use of insulin (HCC) 02/24/2017  . HTN (hypertension) 02/24/2017  . Substance abuse (HCC) 02/24/2017     Current Outpatient Medications on File Prior to Visit  Medication Sig Dispense Refill  . amLODipine (NORVASC) 5 MG tablet Take 1 tablet (5 mg total) by mouth daily. 30 tablet 3  . atorvastatin (LIPITOR) 10 MG tablet Take 1 tablet (10 mg total) by mouth daily. 90 tablet 3  . diclofenac sodium (VOLTAREN) 1 % GEL Apply 4 g topically 4 (four) times daily. 100 g 4  . gabapentin (NEURONTIN) 300 MG capsule 1 cap daily at bedtime x 1 wk then BID 60 capsule 3  . glimepiride (AMARYL) 2 MG tablet Take 1 tablet (2 mg total) by mouth daily before breakfast. 30 tablet 3  . hydrochlorothiazide (HYDRODIURIL)  12.5 MG tablet Take 1 tablet (12.5 mg total) by mouth daily. 30 tablet 3  . metFORMIN (GLUCOPHAGE) 500 MG tablet Take 1 tablet (500 mg total) by mouth 2 (two) times daily with a meal. 180 tablet 3  . tamsulosin (FLOMAX) 0.4 MG CAPS capsule Take 1 capsule (0.4 mg total) by mouth at bedtime. 30 capsule 3  . vitamin B-12 (CYANOCOBALAMIN) 1000 MCG tablet Take 1 tablet (1,000 mcg total) by mouth daily. 90 tablet 1   No current facility-administered medications on file prior to visit.     Allergies  Allergen Reactions  . Amoxicillin   . Lisinopril     hyperkalemia    Social History   Socioeconomic History  . Marital status: Single    Spouse name:  Not on file  . Number of children: Not on file  . Years of education: Not on file  . Highest education level: Not on file  Occupational History  . Not on file  Social Needs  . Financial resource strain: Not on file  . Food insecurity:    Worry: Not on file    Inability: Not on file  . Transportation needs:    Medical: Not on file    Non-medical: Not on file  Tobacco Use  . Smoking status: Current Every Day Smoker    Packs/day: 3.00    Types: Cigarettes  . Smokeless tobacco: Never Used  . Tobacco comment: 3 packs a day  Substance and Sexual Activity  . Alcohol use: Yes    Comment: ocasionally  . Drug use: Yes    Types: Cocaine  . Sexual activity: Not on file  Lifestyle  . Physical activity:    Days per week: Not on file    Minutes per session: Not on file  . Stress: Not on file  Relationships  . Social connections:    Talks on phone: Not on file    Gets together: Not on file    Attends religious service: Not on file    Active member of club or organization: Not on file    Attends meetings of clubs or organizations: Not on file    Relationship status: Not on file  . Intimate partner violence:    Fear of current or ex partner: Not on file    Emotionally abused: Not on file    Physically abused: Not on file    Forced sexual activity: Not on file  Other Topics Concern  . Not on file  Social History Narrative  . Not on file    No family history on file.  ROS: Review of Systems Negative except as stated above  PHYSICAL EXAM: BP (!) 154/104   Pulse 74   Temp 98.3 F (36.8 C) (Oral)   Resp 16   Wt 251 lb 9.6 oz (114.1 kg)   SpO2 96%   BMI 35.09 kg/m   Physical Exam  General appearance - alert, well appearing, and in no distress Mental status - normal mood, behavior, speech, dress, motor activity, and thought processes Neck - supple, no significant adenopathy Chest - clear to auscultation, no wheezes, rales or rhonchi, symmetric air entry Heart - normal  rate, regular rhythm, normal S1, S2, no murmurs, rubs, clicks or gallops Musculoskeletal -knees: No point tenderness.  Good range of motion.  Results for orders placed or performed in visit on 09/19/18  POCT glucose (manual entry)  Result Value Ref Range   POC Glucose 230 (A) 70 - 99 mg/dl  CMP Latest Ref Rng & Units 08/09/2018 07/20/2017 03/18/2017  Glucose 65 - 99 mg/dL 532(Y) - -  BUN 6 - 24 mg/dL 12 - -  Creatinine 2.33 - 1.27 mg/dL 4.35 6.86 1.68  Sodium 134 - 144 mmol/L 138 - -  Potassium 3.5 - 5.2 mmol/L 4.5 - 5.3(H)  Chloride 96 - 106 mmol/L 97 - -  CO2 20 - 29 mmol/L 23 - -  Calcium 8.7 - 10.2 mg/dL 9.6 - -  Total Protein 6.0 - 8.5 g/dL 6.8 - -  Total Bilirubin 0.0 - 1.2 mg/dL 0.6 - -  Alkaline Phos 39 - 117 IU/L 146(H) - -  AST 0 - 40 IU/L 18 - -  ALT 0 - 44 IU/L 35 - -   Lipid Panel     Component Value Date/Time   CHOL 178 08/09/2018 1544   TRIG 254 (H) 08/09/2018 1544   HDL 28 (L) 08/09/2018 1544   CHOLHDL 6.4 (H) 08/09/2018 1544   LDLCALC 99 08/09/2018 1544    CBC    Component Value Date/Time   WBC 7.5 08/09/2018 1544   RBC 5.37 08/09/2018 1544   HGB 16.0 08/09/2018 1544   HCT 47.7 08/09/2018 1544   PLT 267 08/09/2018 1544   MCV 89 08/09/2018 1544   MCH 29.8 08/09/2018 1544   MCHC 33.5 08/09/2018 1544   RDW 12.5 08/09/2018 1544   LYMPHSABS 2.7 02/15/2017 1137   EOSABS 0.2 02/15/2017 1137   BASOSABS 0.0 02/15/2017 1137    ASSESSMENT AND PLAN: 1. Uncontrolled type 2 diabetes mellitus without complication, without long-term current use of insulin (HCC) Patient has been out of his medications even though he has refills.  I recommend 90-day supply at a time if he is able to afford so that he is not having to come back to the pharmacy every month.  Patient is agreeable to this plan. -Commended him on trying to make changes in eating habits.  For now until blood sugars are better controlled I recommend holding off on eating oranges.  We went over portion  sizes for bananas and other fruits. - POCT glucose (manual entry) - glimepiride (AMARYL) 4 MG tablet; Take 1 tablet (4 mg total) by mouth daily before breakfast.  Dispense: 90 tablet; Refill: 3 - metFORMIN (GLUCOPHAGE) 1000 MG tablet; Take 1 tablet (1,000 mg total) by mouth 2 (two) times daily with a meal.  Dispense: 180 tablet; Refill: 3  2. Essential hypertension Not at goal.  Again patient is agreeable to 90-day supply of his medications which would be more convenient for him. - hydrochlorothiazide (HYDRODIURIL) 12.5 MG tablet; Take 1 tablet (12.5 mg total) by mouth daily.  Dispense: 90 tablet; Refill: 3 - amLODipine (NORVASC) 5 MG tablet; Take 1 tablet (5 mg total) by mouth daily.  Dispense: 90 tablet; Refill: 3  3. Benign prostatic hyperplasia with urinary frequency Better on Flomax - tamsulosin (FLOMAX) 0.4 MG CAPS capsule; Take 1 capsule (0.4 mg total) by mouth at bedtime.  Dispense: 90 capsule; Refill: 3  4. Tobacco dependence Commended him on cutting back.  He will continue to try and cut down over time.  Less than 5 minutes spent on counseling.  5. Primary osteoarthritis of both knees Discussed the importance of staying active and weight loss.  He is agreeable to trying diclofenac's gel - diclofenac sodium (VOLTAREN) 1 % GEL; Apply 4 g topically 4 (four) times daily.  Dispense: 100 g; Refill: 4  6. Cervical radiculopathy Patient states that he is willing  to see the neurosurgeon here in town and plans to pay out-of-pocket for the consultation.  7. Pure hypercholesterolemia - atorvastatin (LIPITOR) 10 MG tablet; Take 1 tablet (10 mg total) by mouth daily.  Dispense: 90 tablet; Refill: 3  8. Need for immunization against influenza - Flu Vaccine QUAD 36+ mos IM     Patient was given the opportunity to ask questions.  Patient verbalized understanding of the plan and was able to repeat key elements of the plan.   Orders Placed This Encounter  Procedures  . POCT glucose  (manual entry)     Requested Prescriptions    No prescriptions requested or ordered in this encounter    No follow-ups on file.  Jonah Blue, MD, FACP

## 2018-09-19 NOTE — Patient Instructions (Addendum)
Your blood sugars are not at goal.  I have sent prescriptions to your pharmacy for 90-day supply on all of your medications for convenience.  We have increased the metformin to 1000 mg twice a day and Amaryl to 4 mg daily.  Continue to monitor your blood sugars.  Your blood pressure is not at goal.  Please try to avoid running out of your medications.  We are continuing the amlodipine and hydrochlorothiazide.  Cut back on salty foods.  Your cholesterol is elevated.  Continue the atorvastatin.    Influenza Virus Vaccine injection (Fluarix) What is this medicine? INFLUENZA VIRUS VACCINE (in floo EN zuh VAHY ruhs vak SEEN) helps to reduce the risk of getting influenza also known as the flu. This medicine may be used for other purposes; ask your health care provider or pharmacist if you have questions. COMMON BRAND NAME(S): Fluarix, Fluzone What should I tell my health care provider before I take this medicine? They need to know if you have any of these conditions: -bleeding disorder like hemophilia -fever or infection -Guillain-Barre syndrome or other neurological problems -immune system problems -infection with the human immunodeficiency virus (HIV) or AIDS -low blood platelet counts -multiple sclerosis -an unusual or allergic reaction to influenza virus vaccine, eggs, chicken proteins, latex, gentamicin, other medicines, foods, dyes or preservatives -pregnant or trying to get pregnant -breast-feeding How should I use this medicine? This vaccine is for injection into a muscle. It is given by a health care professional. A copy of Vaccine Information Statements will be given before each vaccination. Read this sheet carefully each time. The sheet may change frequently. Talk to your pediatrician regarding the use of this medicine in children. Special care may be needed. Overdosage: If you think you have taken too much of this medicine contact a poison control center or emergency room at  once. NOTE: This medicine is only for you. Do not share this medicine with others. What if I miss a dose? This does not apply. What may interact with this medicine? -chemotherapy or radiation therapy -medicines that lower your immune system like etanercept, anakinra, infliximab, and adalimumab -medicines that treat or prevent blood clots like warfarin -phenytoin -steroid medicines like prednisone or cortisone -theophylline -vaccines This list may not describe all possible interactions. Give your health care provider a list of all the medicines, herbs, non-prescription drugs, or dietary supplements you use. Also tell them if you smoke, drink alcohol, or use illegal drugs. Some items may interact with your medicine. What should I watch for while using this medicine? Report any side effects that do not go away within 3 days to your doctor or health care professional. Call your health care provider if any unusual symptoms occur within 6 weeks of receiving this vaccine. You may still catch the flu, but the illness is not usually as bad. You cannot get the flu from the vaccine. The vaccine will not protect against colds or other illnesses that may cause fever. The vaccine is needed every year. What side effects may I notice from receiving this medicine? Side effects that you should report to your doctor or health care professional as soon as possible: -allergic reactions like skin rash, itching or hives, swelling of the face, lips, or tongue Side effects that usually do not require medical attention (report to your doctor or health care professional if they continue or are bothersome): -fever -headache -muscle aches and pains -pain, tenderness, redness, or swelling at site where injected -weak or tired This list  may not describe all possible side effects. Call your doctor for medical advice about side effects. You may report side effects to FDA at 1-800-FDA-1088. Where should I keep my  medicine? This vaccine is only given in a clinic, pharmacy, doctor's office, or other health care setting and will not be stored at home. NOTE: This sheet is a summary. It may not cover all possible information. If you have questions about this medicine, talk to your doctor, pharmacist, or health care provider.  2019 Elsevier/Gold Standard (2008-01-18 09:30:40)

## 2018-10-24 MED FILL — GLIMEPIRIDE 4 MG TABS: 4 | 30 days supply | Qty: 30 | Fill #1

## 2018-10-24 MED FILL — AMLODIPINE BESYLATE 5 MG TA: 5 | 30 days supply | Qty: 30 | Fill #1

## 2018-10-24 MED FILL — metFORMIN HCL 1000 MG TABS: 1000 | 30 days supply | Qty: 60 | Fill #1

## 2018-10-24 MED FILL — ATORVASTATIN 10 MG TABLET: 10 | 30 days supply | Qty: 30 | Fill #1

## 2018-10-24 MED FILL — HYDROCHLOROTHIAZIDE 12.5 MG: 12.5 | 30 days supply | Qty: 30 | Fill #1

## 2018-11-21 ENCOUNTER — Other Ambulatory Visit: Payer: Self-pay

## 2018-11-21 ENCOUNTER — Ambulatory Visit: Payer: Self-pay | Attending: Internal Medicine | Admitting: Internal Medicine

## 2018-12-01 MED FILL — metFORMIN HCL 1000 MG TABS: 1000 | 30 days supply | Qty: 60 | Fill #2

## 2018-12-01 MED FILL — GLIMEPIRIDE 4 MG TABS: 4 | 30 days supply | Qty: 30 | Fill #2

## 2018-12-01 MED FILL — ?ATORVASTATIN 10 MG TABLET: 10 | 30 days supply | Qty: 30 | Fill #2

## 2018-12-01 MED FILL — GABAPENTIN 300 MG CAPSULE: 300 | 33 days supply | Qty: 60 | Fill #1

## 2018-12-01 MED FILL — AMLODIPINE BESYLATE 5 MG TA: 5 | 30 days supply | Qty: 30 | Fill #2

## 2018-12-01 MED FILL — HYDROCHLOROTHIAZIDE 12.5 MG: 12.5 | 30 days supply | Qty: 30 | Fill #2

## 2018-12-08 ENCOUNTER — Other Ambulatory Visit: Payer: Self-pay

## 2018-12-08 ENCOUNTER — Ambulatory Visit: Payer: Self-pay | Attending: Internal Medicine | Admitting: Internal Medicine

## 2018-12-08 ENCOUNTER — Encounter: Payer: Self-pay | Admitting: Internal Medicine

## 2018-12-08 DIAGNOSIS — M170 Bilateral primary osteoarthritis of knee: Secondary | ICD-10-CM

## 2018-12-08 DIAGNOSIS — M5412 Radiculopathy, cervical region: Secondary | ICD-10-CM

## 2018-12-08 DIAGNOSIS — F1910 Other psychoactive substance abuse, uncomplicated: Secondary | ICD-10-CM

## 2018-12-08 DIAGNOSIS — E118 Type 2 diabetes mellitus with unspecified complications: Secondary | ICD-10-CM

## 2018-12-08 DIAGNOSIS — I10 Essential (primary) hypertension: Secondary | ICD-10-CM

## 2018-12-08 MED ORDER — CELECOXIB 200 MG PO CAPS: 200.0000 mg | ORAL_CAPSULE | Freq: Every day | ORAL | 3 refills | Status: DC

## 2018-12-08 MED ORDER — DULOXETINE HCL 20 MG PO CPEP: 20.0000 mg | ORAL_CAPSULE | Freq: Every day | ORAL | 3 refills | Status: DC

## 2018-12-08 MED FILL — ?DULOXETINE HCL 20MG CPE: 20 | 30 days supply | Qty: 30 | Fill #0

## 2018-12-08 MED FILL — ?CELECOXIB 200 MG CAPSULE: 200 | 30 days supply | Qty: 30 | Fill #0

## 2018-12-08 NOTE — Progress Notes (Signed)
Pt sates his b/l knees and calfs have been giving him some issues.   Pt states it's more so his left leg. Pt states when he climbs a ladder for work he feels a burning throbbing sensation

## 2018-12-08 NOTE — Progress Notes (Signed)
Virtual Visit via Telephone Note Due to current restrictions/limitations of in-office visits due to the COVID-19 pandemic, this scheduled clinical appointment was converted to a telehealth visit  I connected with Antonio PilonGerald Lozito on 12/08/18 at 2:45 p.m EDT by telephone and verified that I am speaking with the correct person using two identifiers. I am in my office.  The patient is at work.  Only the patient and myself participated in this encounter.  I discussed the limitations, risks, security and privacy concerns of performing an evaluation and management service by telephone and the availability of in person appointments. I also discussed with the patient that there may be a patient responsible charge related to this service. The patient expressed understanding and agreed to proceed.   History of Present Illness: Pt withhistory of HTN, DM 2, HL, OA knees, tob dep, BPH, cervical radiculopathy,erectile dysfunction, cocaine abuse, street opiate abuse. Patient last seen 09/2018.  Purpose of today's visit is for chronic disease management  HTN: has a device to check BP. Reports BP has been good, checks 2 x a wk.  Range 120-140/75-80s but for most part it is less than 130/80.  He tries to limit salt in the foods.  Denies any chest pains or shortness of breath.  DM:  Checks BS once every 2 wks.  Most readings less than 140 On the road a lot due to work.  Works [employer] representative[employer]. Eating habits:  "I keep blowing up like a bull frog."  Still eats a lot of fruits.  Admits that he is big on red meat and pork  OA Knees:  Sometimes knees give out.  + stiffness and pain when he sits for any period.  Has to pick RT leg up to get in his truck.  Feels decrease strength in both legs from knees down Takes 12-20 Aleves and 4-10 BCs a day for back and knees Had quit purchasing opiates of the street but still uses cocaine a few times a mth.  In the past mth, he purchase 4270 Percocet from the street because pain in  knees was so bad -Voltaren gel does not help  Cervical Radiculopathy: never received call regarding neurosurg referral from last visit.  He would like to pursue this    Observations/Objective: No direct observation done as this was a telephone encounter.  Last A1c was 6.2 in February of this year  Results for orders placed or performed in visit on 09/19/18  POCT glucose (manual entry)  Result Value Ref Range   POC Glucose 230 (A) 70 - 99 mg/dl   Lab Results  Component Value Date   CHOL 178 08/09/2018   HDL 28 (L) 08/09/2018   LDLCALC 99 08/09/2018   TRIG 254 (H) 08/09/2018   CHOLHDL 6.4 (H) 08/09/2018     Chemistry      Component Value Date/Time   NA 138 08/09/2018 1544   K 4.5 08/09/2018 1544   CL 97 08/09/2018 1544   CO2 23 08/09/2018 1544   BUN 12 08/09/2018 1544   CREATININE 1.03 08/09/2018 1544      Component Value Date/Time   CALCIUM 9.6 08/09/2018 1544   ALKPHOS 146 (H) 08/09/2018 1544   AST 18 08/09/2018 1544   ALT 35 08/09/2018 1544   BILITOT 0.6 08/09/2018 1544       Assessment and Plan: 1. Type 2 diabetes mellitus with complication, without long-term current use of insulin (HCC) Dietary counseling given.  Encouraged him to cut back on pork and red meat.  Try to eat more white meat like chicken, [place] and seafood.  Cut back on white carbohydrates. Encouraged him to check blood sugars at least once a day  Continue metformin and Amaryl  2. Essential hypertension Advised patient that the goal is 130/80 or lower.  I have asked him to write down his numbers.  Continue HCTZ and amlodipine  3. Primary osteoarthritis of both knees Advised against taking so much over-the-counter NSAIDs and BCs as it can adversely affect his kidneys.  Also advised against purchasing controlled substances off the street. He is agreeable to trying Celebrex with Cymbalta. He will go to radiology to have x-rays done on the knees Encourage him to try to stay active.  Encourage  weight loss - DG Knee Complete 4 Views Right; Future - DG Knee Complete 4 Views Left; Future - celecoxib (CELEBREX) 200 MG capsule; Take 1 capsule (200 mg total) by mouth daily.  Dispense: 30 capsule; Refill: 3 - DULoxetine (CYMBALTA) 20 MG capsule; Take 1 capsule (20 mg total) by mouth daily.  Dispense: 30 capsule; Refill: 3  4. Cervical radiculopathy - Ambulatory referral to Neurosurgery  5. Substance abuse (HCC) Strongly advised against purchasing narcotics on the street.  He is not interested in any treatment programs   Follow Up Instructions: F/u in 2 mths   I discussed the assessment and treatment plan with the patient. The patient was provided an opportunity to ask questions and all were answered. The patient agreed with the plan and demonstrated an understanding of the instructions.   The patient was advised to call back or seek an in-person evaluation if the symptoms worsen or if the condition fails to improve as anticipated.  I provided 17 minutes of non-face-to-face time during this encounter.   Jonah Blue, MD

## 2019-01-12 MED FILL — ?ATORVASTATIN 10 MG TABLET: 10 | 30 days supply | Qty: 30 | Fill #3

## 2019-01-12 MED FILL — ?DULOXETINE HCL 20MG CPE: 20 | 30 days supply | Qty: 30 | Fill #1

## 2019-01-12 MED FILL — GLIMEPIRIDE 4 MG TABS: 4 | 30 days supply | Qty: 30 | Fill #3

## 2019-01-12 MED FILL — GABAPENTIN 300 MG CAPSULE: 300 | 33 days supply | Qty: 60 | Fill #2

## 2019-01-12 MED FILL — ?CELECOXIB 200 MG CAPSULE: 200 | 30 days supply | Qty: 30 | Fill #1

## 2019-01-12 MED FILL — HYDROCHLOROTHIAZIDE 12.5 MG: 12.5 | 30 days supply | Qty: 30 | Fill #3

## 2019-01-12 MED FILL — metFORMIN HCL 1000 MG TABS: 1000 | 30 days supply | Qty: 60 | Fill #3

## 2019-01-12 MED FILL — AMLODIPINE BESYLATE 5 MG TA: 5 | 30 days supply | Qty: 30 | Fill #3

## 2019-02-22 MED FILL — ?AMLODIPINE BESYLATE 5MG TA: 5 | 30 days supply | Qty: 30 | Fill #4

## 2019-02-22 MED FILL — ?CELECOXIB 200 MG CAPSULE: 200 | 30 days supply | Qty: 30 | Fill #2

## 2019-02-22 MED FILL — ?ATORVASTATIN 10 MG TABLET: 10 | 30 days supply | Qty: 30 | Fill #4

## 2019-02-22 MED FILL — GABAPENTIN 300 MG CAPSULE: 300 | 33 days supply | Qty: 60 | Fill #3

## 2019-02-22 MED FILL — metFORMIN HCL 1000 MG TABS: 1000 | 30 days supply | Qty: 60 | Fill #4

## 2019-02-22 MED FILL — GLIMEPIRIDE 4 MG TABS: 4 | 30 days supply | Qty: 30 | Fill #4

## 2019-02-22 MED FILL — ?DULOXETINE HCL 20MG CPE: 20 | 30 days supply | Qty: 30 | Fill #2

## 2019-02-22 MED FILL — HYDROCHLOROTHIAZIDE 12.5 MG: 12.5 | 30 days supply | Qty: 30 | Fill #4

## 2019-03-31 ENCOUNTER — Ambulatory Visit: Payer: Self-pay | Admitting: Internal Medicine

## 2019-04-10 MED FILL — ?DULOXETINE HCL 20MG CPE: 20 | 30 days supply | Qty: 30 | Fill #3

## 2019-04-10 MED FILL — ?CELECOXIB 200 MG CAPSULE: 200 | 30 days supply | Qty: 30 | Fill #3

## 2019-04-10 MED FILL — ATORVASTATIN 10 MG TABLET: 10 | 60 days supply | Qty: 60 | Fill #5

## 2019-04-10 MED FILL — metFORMIN HCL 1000 MG TABS: 1000 | 60 days supply | Qty: 120 | Fill #5

## 2019-04-10 MED FILL — GLIMEPIRIDE 4 MG TABS: 4 | 60 days supply | Qty: 60 | Fill #5

## 2019-04-10 MED FILL — HYDROCHLOROTHIAZIDE 12.5 MG: 12.5 | 60 days supply | Qty: 60 | Fill #5

## 2019-04-10 MED FILL — AMLODIPINE BESYLATE 5 MG TA: 5 | 60 days supply | Qty: 60 | Fill #5

## 2019-04-27 ENCOUNTER — Encounter: Payer: Self-pay | Admitting: Internal Medicine

## 2019-04-27 ENCOUNTER — Other Ambulatory Visit: Payer: Self-pay

## 2019-04-27 ENCOUNTER — Ambulatory Visit: Payer: Self-pay | Attending: Internal Medicine | Admitting: Internal Medicine

## 2019-04-27 VITALS — BP 146/101 | HR 80 | Temp 98.2°F | Resp 16 | Wt 244.2 lb

## 2019-04-27 DIAGNOSIS — F17200 Nicotine dependence, unspecified, uncomplicated: Secondary | ICD-10-CM

## 2019-04-27 DIAGNOSIS — M170 Bilateral primary osteoarthritis of knee: Secondary | ICD-10-CM

## 2019-04-27 DIAGNOSIS — I10 Essential (primary) hypertension: Secondary | ICD-10-CM

## 2019-04-27 DIAGNOSIS — E119 Type 2 diabetes mellitus without complications: Secondary | ICD-10-CM

## 2019-04-27 LAB — POCT GLYCOSYLATED HEMOGLOBIN (HGB A1C): HbA1c, POC (controlled diabetic range): 7.7 % — AB (ref 0.0–7.0)

## 2019-04-27 LAB — GLUCOSE, POCT (MANUAL RESULT ENTRY): POC Glucose: 170 mg/dl — AB (ref 70–99)

## 2019-04-27 MED ORDER — DULOXETINE HCL 30 MG PO CPEP: 30.0000 mg | ORAL_CAPSULE | Freq: Every day | ORAL | 4 refills | Status: DC

## 2019-04-27 MED ORDER — AMLODIPINE BESYLATE 10 MG PO TABS: 10.0000 mg | ORAL_TABLET | Freq: Every day | ORAL | 5 refills | Status: DC

## 2019-04-27 MED ORDER — SITAGLIPTIN PHOSPHATE 50 MG PO TABS: 50.0000 mg | ORAL_TABLET | Freq: Every day | ORAL | 5 refills | Status: DC

## 2019-04-27 MED ORDER — DICLOFENAC SODIUM 1 % TD GEL: 2.0000 g | Freq: Four times a day (QID) | TRANSDERMAL | 2 refills | Status: DC

## 2019-04-27 MED FILL — !JANUVIA 50 MG TABLET: 50 | 30 days supply | Qty: 30 | Fill #0

## 2019-04-27 MED FILL — ?AMLODIPINE BESYLATE 10 MG: 10 | 30 days supply | Qty: 30 | Fill #0

## 2019-04-27 MED FILL — ?DULoxetine HCL 30MG CPEP: 30 | 30 days supply | Qty: 30 | Fill #0

## 2019-04-27 MED FILL — DICLOFENAC SODIUM 1% GEL: 1 | 12 days supply | Qty: 100 | Fill #0

## 2019-04-27 NOTE — Patient Instructions (Addendum)
Your blood pressure is not at goal.  Increase amlodipine from 5 mg daily to 10 mg daily.  This means he will take 2 of the 5 mg every day until you run out.  I have sent an updated prescription to your pharmacy for the 10 mg tablet.  Please go over to the hospital at [facility] radiology to have the x-rays of your knees done. Stop taking the Celebrex medication since you are taking Advil and Aleve. Increase Cymbalta to 30 mg daily. I have prescribed some Voltaren gel for you to use on the needs.  Your diabetes has improved but not at goal as yet.  Continue Metformin and glimepiride.  We have added a new medication called Januvia that you will take once a day.  Continue to work on improving her eating habits. As a diabetic you need to get your eye exam done at least once a year.  Please schedule an eye exam at your earliest convenience and have one time.   Diabetes Mellitus and Standards of Medical Care Managing diabetes (diabetes mellitus) can be complicated. Your diabetes treatment may be managed by a team of health care providers, including:  A physician who specializes in diabetes (endocrinologist).  A nurse practitioner or physician assistant.  Nurses.  A diet and nutrition specialist (registered dietitian).  A certified diabetes educator (CDE).  An exercise specialist.  A pharmacist.  An eye doctor.  A foot specialist (podiatrist).  A dentist.  A primary care provider.  A mental health provider. Your health care providers follow guidelines to help you get the best quality of care. The following schedule is a general guideline for your diabetes management plan. Your health care providers may give you more specific instructions. Physical exams Upon being diagnosed with diabetes mellitus, and each year after that, your health care provider will ask about your medical and family history. He or she will also do a physical exam. Your exam may include:  Measuring your height,  weight, and body mass index (BMI).  Checking your blood pressure. This will be done at every routine medical visit. Your target blood pressure may vary depending on your medical conditions, your age, and other factors.  Thyroid gland exam.  Skin exam.  Screening for damage to your nerves (peripheral neuropathy). This may include checking the pulse in your legs and feet and checking the level of sensation in your hands and feet.  A complete foot exam to inspect the structure and skin of your feet, including checking for cuts, bruises, redness, blisters, sores, or other problems.  Screening for blood vessel (vascular) problems, which may include checking the pulse in your legs and feet and checking your temperature. Blood tests Depending on your treatment plan and your personal needs, you may have the following tests done:  HbA1c (hemoglobin A1c). This test provides information about blood sugar (glucose) control over the previous 2-3 months. It is used to adjust your treatment plan, if needed. This test will be done: ? At least 2 times a year, if you are meeting your treatment goals. ? 4 times a year, if you are not meeting your treatment goals or if treatment goals have changed.  Lipid testing, including total, LDL, and HDL cholesterol and triglyceride levels. ? The goal for LDL is less than 100 mg/dL (5.5 mmol/L). If you are at high risk for complications, the goal is less than 70 mg/dL (3.9 mmol/L). ? The goal for HDL is 40 mg/dL (2.2 mmol/L) or higher for  men and 50 mg/dL (2.8 mmol/L) or higher for women. An HDL cholesterol of 60 mg/dL (3.3 mmol/L) or higher gives some protection against heart disease. ? The goal for triglycerides is less than 150 mg/dL (8.3 mmol/L).  Liver function tests.  Kidney function tests.  Thyroid function tests. Dental and eye exams  Visit your dentist two times a year.  If you have type 1 diabetes, your health care provider may recommend an eye exam 3-5  years after you are diagnosed, and then once a year after your first exam. ? For children with type 1 diabetes, a health care provider may recommend an eye exam when your child is age 11 or older and has had diabetes for 3-5 years. After the first exam, your child should get an eye exam once a year.  If you have type 2 diabetes, your health care provider may recommend an eye exam as soon as you are diagnosed, and then once a year after your first exam. Immunizations   The yearly flu (influenza) vaccine is recommended for everyone 6 months or older who has diabetes.  The pneumonia (pneumococcal) vaccine is recommended for everyone 2 years or older who has diabetes. If you are 12 or older, you may get the pneumonia vaccine as a series of two separate shots.  The hepatitis B vaccine is recommended for adults shortly after being diagnosed with diabetes.  Adults and children with diabetes should receive all other vaccines according to age-specific recommendations from the Centers for Disease Control and Prevention (CDC). Mental and emotional health Screening for symptoms of eating disorders, anxiety, and depression is recommended at the time of diagnosis and afterward as needed. If your screening shows that you have symptoms (positive screening result), you may need more evaluation and you may work with a mental health care provider. Treatment plan Your treatment plan will be reviewed at every medical visit. You and your health care provider will discuss:  How you are taking your medicines, including insulin.  Any side effects you are experiencing.  Your blood glucose target goals.  The frequency of your blood glucose monitoring.  Lifestyle habits, such as activity level as well as tobacco, alcohol, and substance use. Diabetes self-management education Your health care provider will assess how well you are monitoring your blood glucose levels and whether you are taking your insulin correctly.  He or she may refer you to:  A certified diabetes educator to manage your diabetes throughout your life, starting at diagnosis.  A registered dietitian who can create or review your personal nutrition plan.  An exercise specialist who can discuss your activity level and exercise plan. Summary  Managing diabetes (diabetes mellitus) can be complicated. Your diabetes treatment may be managed by a team of health care providers.  Your health care providers follow guidelines in order to help you get the best quality of care.  Standards of care including having regular physical exams, blood tests, blood pressure monitoring, immunizations, screening tests, and education about how to manage your diabetes.  Your health care providers may also give you more specific instructions based on your individual health. This information is not intended to replace advice given to you by your health care provider. Make sure you discuss any questions you have with your health care provider. Document Released: 04/19/2009 Document Revised: 03/11/2018 Document Reviewed: 03/20/2016 Elsevier Patient Education  2020 [employer].

## 2019-04-27 NOTE — Progress Notes (Signed)
Patient ID: Antonio Pena, male    DOB: 10/07/1965  MRN: [id number]  CC: Diabetes and Leg Pain   Subjective: Antonio PilonGerald Pena is a 53 y.o. male who presents for chronic ds management His concerns today include:  Pt withhistory of HTN, DM 2, HL, OA knees, tob dep, BPH, cervical radiculopathy,erectile dysfunction, cocaine abuse, street opiate abuse.  Knees throbbing all night long Has to walk up steps to bedroom Tried diff knee braces;  nothing works Using an OTC jt supplement which does not help either.  X-rays of the knees ordered on last visit but he has not had them done as yet.   Taking Celebrex and Cymbalta as prescribed but does not feel they help. Taking Ibuprofen, BC and Aleve.  "I eat um like candy." States he is off Percocets - use to take up to 15 a day "and the pain stayed down at a minimum and I could work just find."   HTN: thinks he is taking BP meds but he does not know names Checks BP and reports # close to what it is today which is high Limits salt in foods No chest pains or shortness of breath.  Though [facility] but left before being evaluated.  He has not had any further episodes.  DM: -cut back on sugars, bacon and country high. -Thinks he takes Metformin and Amaryl as prescribed Check BS BID.  Range 140, highest has been 420 Very active  Tob dep: Down to 1 pk a day from 3 pks a day.   HM:  [facility]  Patient Active Problem List   Diagnosis Date Noted  . Primary osteoarthritis of both knees 09/19/2018  . Benign prostatic hyperplasia with urinary frequency 09/19/2018  . Tobacco dependence 08/09/2018  . Cervical radiculopathy 08/09/2018  . Type 2 diabetes mellitus with complication, without long-term current use of insulin (HCC) 02/24/2017  . HTN (hypertension) 02/24/2017  . Substance abuse (HCC) 02/24/2017     Current Outpatient Medications on File Prior to Visit   Medication Sig Dispense Refill  . amLODipine (NORVASC) 5 MG tablet Take 1 tablet (5 mg total) by mouth daily. 90 tablet 3  . atorvastatin (LIPITOR) 10 MG tablet Take 1 tablet (10 mg total) by mouth daily. 90 tablet 3  . celecoxib (CELEBREX) 200 MG capsule Take 1 capsule (200 mg total) by mouth daily. 30 capsule 3  . DULoxetine (CYMBALTA) 20 MG capsule Take 1 capsule (20 mg total) by mouth daily. 30 capsule 3  . gabapentin (NEURONTIN) 300 MG capsule 1 cap daily at bedtime x 1 wk then BID 60 capsule 3  . glimepiride (AMARYL) 4 MG tablet Take 1 tablet (4 mg total) by mouth daily before breakfast. 90 tablet 3  . hydrochlorothiazide (HYDRODIURIL) 12.5 MG tablet Take 1 tablet (12.5 mg total) by mouth daily. 90 tablet 3  . metFORMIN (GLUCOPHAGE) 1000 MG tablet Take 1 tablet (1,000 mg total) by mouth 2 (two) times daily with a meal. 180 tablet 3  . tamsulosin (FLOMAX) 0.4 MG CAPS capsule Take 1 capsule (0.4 mg total) by mouth at bedtime. 90 capsule 3  . vitamin B-12 (CYANOCOBALAMIN) 1000 MCG tablet Take 1 tablet (1,000 mcg total) by mouth daily. 90 tablet 1   No current facility-administered medications on file prior to visit.     Allergies  Allergen Reactions  . Amoxicillin   . Lisinopril     hyperkalemia  Social History   Socioeconomic History  . Marital status: Single    Spouse name: Not on file  . Number of children: Not on file  . Years of education: Not on file  . Highest education level: Not on file  Occupational History  . Not on file  Social Needs  . Financial resource strain: Not on file  . Food insecurity    Worry: Not on file    Inability: Not on file  . Transportation needs    Medical: Not on file    Non-medical: Not on file  Tobacco Use  . Smoking status: Current Every Day Smoker    Packs/day: 3.00    Types: Cigarettes  . Smokeless tobacco: Never Used  . Tobacco comment: 3 packs a day  Substance and Sexual Activity  . Alcohol use: Yes    Comment: ocasionally   . Drug use: Yes    Types: Cocaine  . Sexual activity: Not on file  Lifestyle  . Physical activity    Days per week: Not on file    Minutes per session: Not on file  . Stress: Not on file  Relationships  . Social Herbalist on phone: Not on file    Gets together: Not on file    Attends religious service: Not on file    Active member of club or organization: Not on file    Attends meetings of clubs or organizations: Not on file    Relationship status: Not on file  . Intimate partner violence    Fear of current or ex partner: Not on file    Emotionally abused: Not on file    Physically abused: Not on file    Forced sexual activity: Not on file  Other Topics Concern  . Not on file  Social History Narrative  . Not on file     ROS: Review of Systems Negative except as stated above  PHYSICAL EXAM: BP (!) 146/101   Pulse 80   Temp 98.2 F (36.8 C) (Oral)   Resp 16   Wt 244 lb 3.2 oz (110.8 kg)   SpO2 95%   BMI 34.06 kg/m   Physical Exam  General appearance - alert, well appearing, and in no distress Mental status - normal mood, behavior, speech, dress, motor activity, and thought processes Neck - supple, no significant adenopathy Chest - clear to auscultation, no wheezes, rales or rhonchi, symmetric air entry Heart - normal rate, regular rhythm, normal S1, S2, no murmurs, rubs, clicks or gallops Musculoskeletal -knees: Mild enlargement of joints.  No point tenderness.  Good range of motion.  Extremities - peripheral pulses normal, no pedal edema, no clubbing or cyanosis  CMP Latest Ref Rng & Units 08/09/2018 07/20/2017 03/18/2017  Glucose 65 - 99 mg/dL 303(H) - -  BUN 6 - 24 mg/dL 12 - -  Creatinine 0.76 - 1.27 mg/dL 1.03 0.98 1.13  Sodium 134 - 144 mmol/L 138 - -  Potassium 3.5 - 5.2 mmol/L 4.5 - 5.3(H)  Chloride 96 - 106 mmol/L 97 - -  CO2 20 - 29 mmol/L 23 - -  Calcium 8.7 - 10.2 mg/dL 9.6 - -  Total Protein 6.0 - 8.5 g/dL 6.8 - -  Total Bilirubin 0.0 -  1.2 mg/dL 0.6 - -  Alkaline Phos 39 - 117 IU/L 146(H) - -  AST 0 - 40 IU/L 18 - -  ALT 0 - 44 IU/L 35 - -   Lipid Panel  Component Value Date/Time   CHOL 178 08/09/2018 1544   TRIG 254 (H) 08/09/2018 1544   HDL 28 (L) 08/09/2018 1544   CHOLHDL 6.4 (H) 08/09/2018 1544   LDLCALC 99 08/09/2018 1544    CBC    Component Value Date/Time   WBC 7.5 08/09/2018 1544   RBC 5.37 08/09/2018 1544   HGB 16.0 08/09/2018 1544   HCT 47.7 08/09/2018 1544   PLT 267 08/09/2018 1544   MCV 89 08/09/2018 1544   MCH 29.8 08/09/2018 1544   MCHC 33.5 08/09/2018 1544   RDW 12.5 08/09/2018 1544   LYMPHSABS 2.7 02/15/2017 1137   EOSABS 0.2 02/15/2017 1137   BASOSABS 0.0 02/15/2017 1137   Results for orders placed or performed in visit on 04/27/19  POCT glucose (manual entry)  Result Value Ref Range   POC Glucose 170 (A) 70 - 99 mg/dl  POCT glycosylated hemoglobin (Hb A1C)  Result Value Ref Range   Hemoglobin A1C     HbA1c POC (<> result, manual entry)     HbA1c, POC (prediabetic range)     HbA1c, POC (controlled diabetic range) 7.7 (A) 0.0 - 7.0 %    ASSESSMENT AND PLAN: 1. Type 2 diabetes mellitus without complication, without long-term current use of insulin (HCC) Improved but not at goal.  Healthy eating habits discussed and encouraged.  Continue metformin and Amaryl.  Add Januvia. - POCT glucose (manual entry) - POCT glycosylated hemoglobin (Hb A1C) - sitaGLIPtin (JANUVIA) 50 MG tablet; Take 1 tablet (50 mg total) by mouth daily.  Dispense: 30 tablet; Refill: 5  2. Essential hypertension Not at goal.  Increase Norvasc to 10 mg daily.  DASH diet discussed and encouraged - amLODipine (NORVASC) 10 MG tablet; Take 1 tablet (10 mg total) by mouth daily.  Dispense: 90 tablet; Refill: 5  3. Primary osteoarthritis of both knees Stop Celebrex since he is taking over-the-counter NSAIDs incessantly.  Continue Cymbalta and increase the dose to 30 mg.  Prescription given for Voltaren gel He will  go and get the x-rays of the knees as was previously ordered. - diclofenac sodium (VOLTAREN) 1 % GEL; Apply 2 g topically 4 (four) times daily.  Dispense: 100 g; Refill: 2 - DULoxetine (CYMBALTA) 30 MG capsule; Take 1 capsule (30 mg total) by mouth daily.  Dispense: 30 capsule; Refill: 4  4. Tobacco dependence Commended him on cutting back.  Discussed health risks associated with smoking.  Advised to quit.  He will continue to work on cutting down.  Less than 5 minutes spent on counseling     Patient was given the opportunity to ask questions.  Patient verbalized understanding of the plan and was able to repeat key elements of the plan.   Orders Placed This Encounter  Procedures  . POCT glucose (manual entry)  . POCT glycosylated hemoglobin (Hb A1C)     Requested Prescriptions    No prescriptions requested or ordered in this encounter    No follow-ups on file.  Jonah Blue, MD, FACP

## 2019-05-30 ENCOUNTER — Other Ambulatory Visit: Payer: Self-pay | Admitting: Internal Medicine

## 2019-05-30 DIAGNOSIS — M170 Bilateral primary osteoarthritis of knee: Secondary | ICD-10-CM

## 2019-06-06 ENCOUNTER — Other Ambulatory Visit: Payer: Self-pay | Admitting: Internal Medicine

## 2019-06-06 DIAGNOSIS — M5412 Radiculopathy, cervical region: Secondary | ICD-10-CM

## 2019-06-06 MED FILL — HYDROCHLOROTHIAZIDE 12.5 MG: 12.5 | 60 days supply | Qty: 60 | Fill #6

## 2019-06-06 MED FILL — ATORVASTATIN 10 MG TABLET: 10 | 60 days supply | Qty: 60 | Fill #6

## 2019-06-06 MED FILL — GLIMEPIRIDE 4 MG TABS: 4 | 60 days supply | Qty: 60 | Fill #6

## 2019-06-06 MED FILL — DICLOFENAC SODIUM 1% GEL: 1 | 12 days supply | Qty: 100 | Fill #1

## 2019-06-06 MED FILL — metFORMIN HCL 1000 MG TABS: 1000 | 60 days supply | Qty: 120 | Fill #6

## 2019-06-06 MED FILL — GABAPENTIN 300 MG CAPSULE: 300 | 33 days supply | Qty: 60 | Fill #0

## 2019-08-15 MED FILL — metFORMIN HCL 1000 MG TABS: 1000 | 60 days supply | Qty: 120 | Fill #7

## 2019-08-15 MED FILL — ATORVASTATIN 10 MG TABLET: 10 | 60 days supply | Qty: 60 | Fill #7

## 2019-08-15 MED FILL — GLIMEPIRIDE 4 MG TABS: 4 | 60 days supply | Qty: 60 | Fill #7

## 2019-08-15 MED FILL — DICLOFENAC SODIUM 1 % GEL: 1 | 12 days supply | Qty: 100 | Fill #2

## 2019-08-15 MED FILL — AMLODIPINE BESYLATE 10 MG T: 10 | 30 days supply | Qty: 30 | Fill #2

## 2019-08-15 MED FILL — JANUVIA 50 MG TABLET: 50 | 30 days supply | Qty: 30 | Fill #2

## 2019-08-15 MED FILL — DULoxetine HCL 30 MG CPEP: 30 | 30 days supply | Qty: 30 | Fill #2

## 2019-10-18 ENCOUNTER — Other Ambulatory Visit: Payer: Self-pay | Admitting: Internal Medicine

## 2019-10-18 DIAGNOSIS — M170 Bilateral primary osteoarthritis of knee: Secondary | ICD-10-CM

## 2019-10-18 MED FILL — DULoxetine HCL 30 MG CPEP: 30 | 30 days supply | Qty: 30 | Fill #3

## 2019-10-18 MED FILL — AMLODIPINE BESYLATE 10 MG T: 10 | 30 days supply | Qty: 30 | Fill #3

## 2019-10-18 MED FILL — JANUVIA 50 MG TABLET: 50 | 30 days supply | Qty: 30 | Fill #3

## 2019-10-19 MED FILL — DICLOFENAC SODIUM 1% GEL: 1 | 12 days supply | Qty: 100 | Fill #0

## 2019-11-15 ENCOUNTER — Other Ambulatory Visit: Payer: Self-pay | Admitting: Internal Medicine

## 2019-11-15 DIAGNOSIS — I10 Essential (primary) hypertension: Secondary | ICD-10-CM

## 2019-11-15 DIAGNOSIS — E7800 Pure hypercholesterolemia, unspecified: Secondary | ICD-10-CM

## 2019-11-16 IMAGING — MR MR CERVICAL SPINE WO/W CM
4 of 8 series · 18 of 48 positions shown · IV contrast (Yes   MULTIHANCE)
Comparison: None.

CLINICAL DATA: Neck pain. Abnormal neuro exam. Persistent left arm
numbness burning for 45 months.

EXAM:
MRI CERVICAL SPINE WITHOUT AND WITH CONTRAST
TECHNIQUE: Multiplanar and multiecho pulse sequences of the cervical spine, to
include the craniocervical junction and cervicothoracic junction,
were obtained without and with intravenous contrast.
CONTRAST:  20 mL MultiHance

[Series 2: T2 · sagittal · 3.0mm · 0.43mm/px · 3 of 15 slices shown (1 of 2)]
[im 1/15]
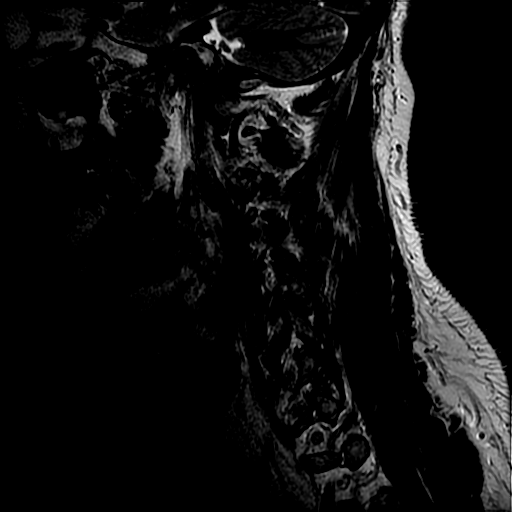
[im 8/15]
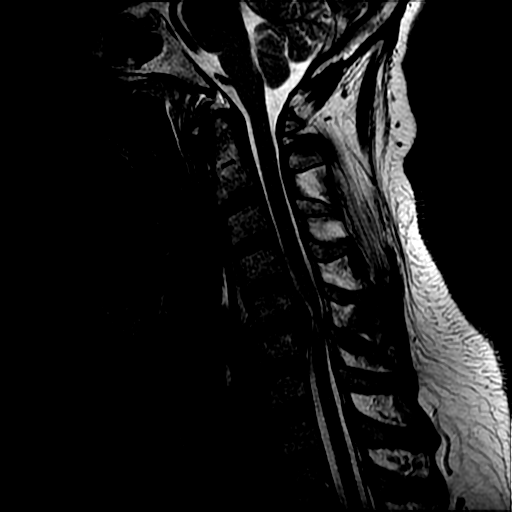
[im 15/15]
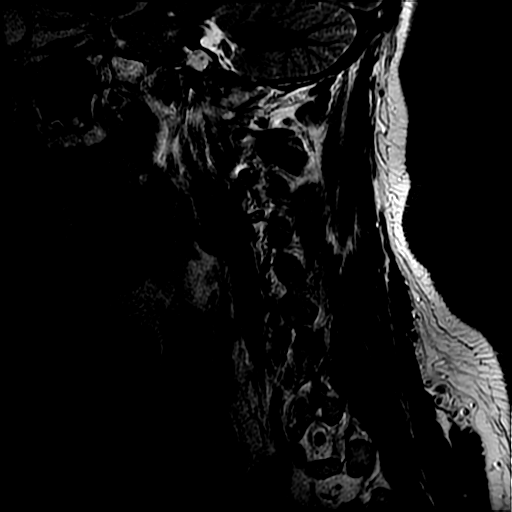

[Series 3: T1 · sagittal · 3.0mm · 0.43mm/px · 3 of 15 slices shown (1 of 2)]
[im 1/15]
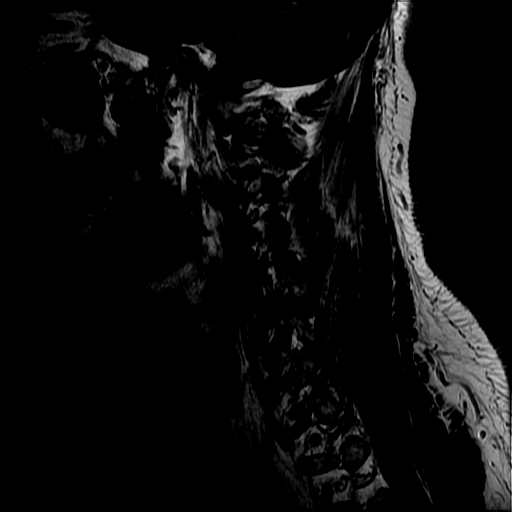
[im 8/15]
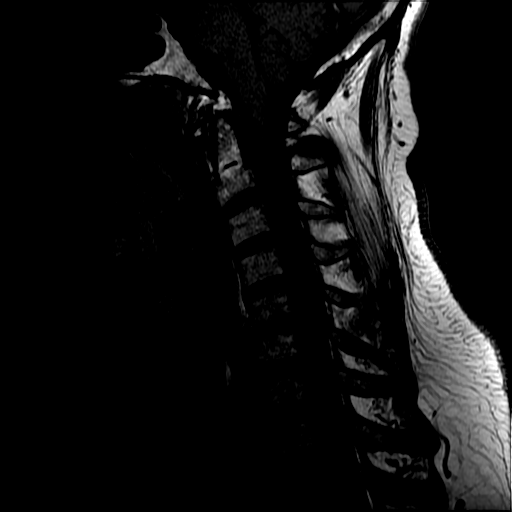
[im 15/15]
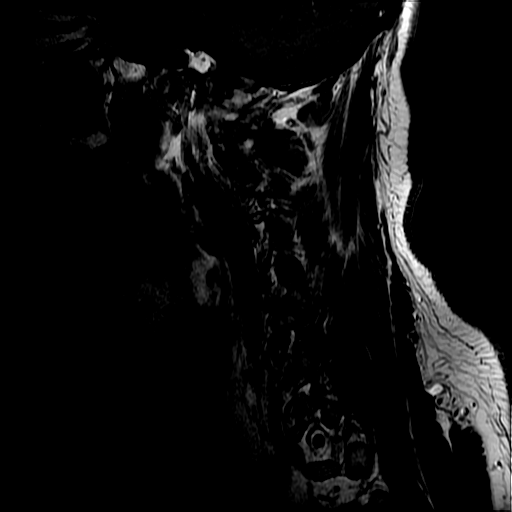

[Series 6: T2 · axial · 3.0mm · 0.39mm/px · z∈[-79,+40]mm · 9 of 36 slices shown (2 of 2)]
[im 1/36]
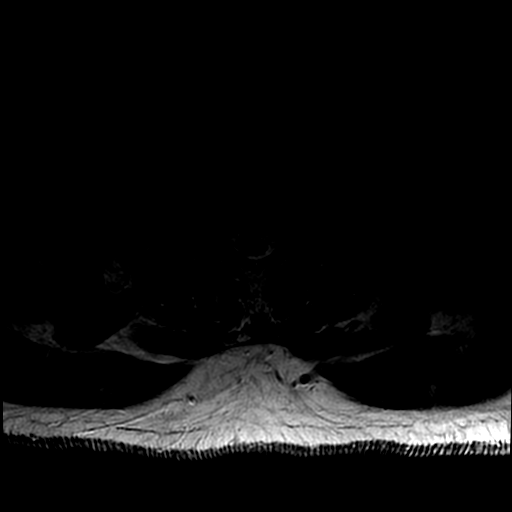
[im 5/36]
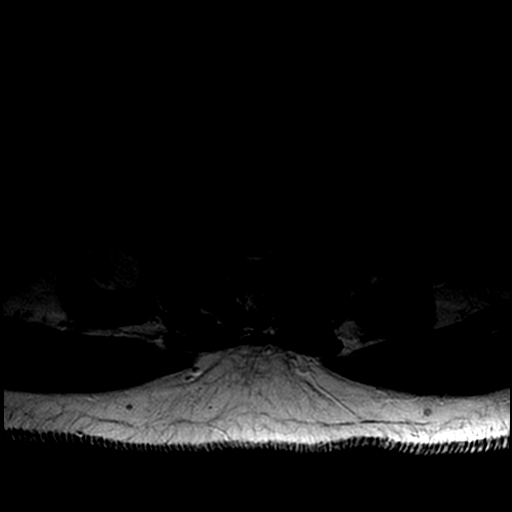
[im 9/36]
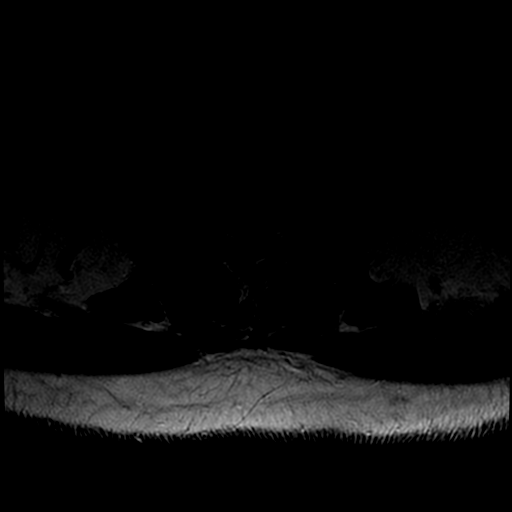
[im 14/36]
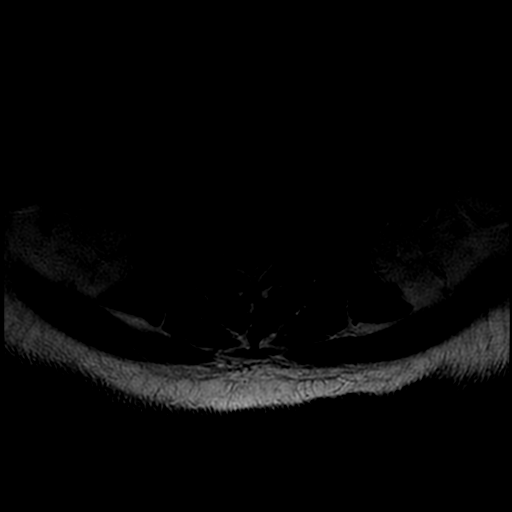
[im 18/36]
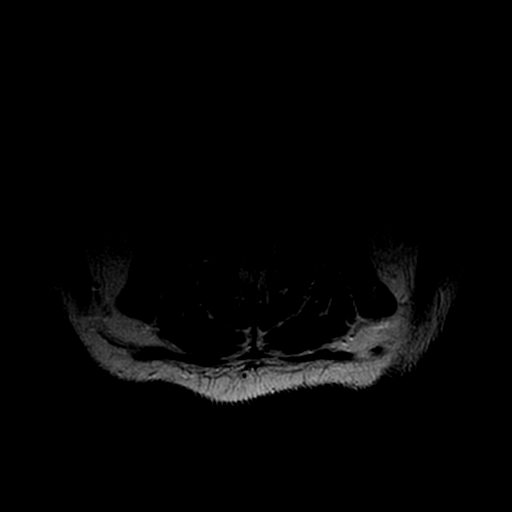
[im 22/36]
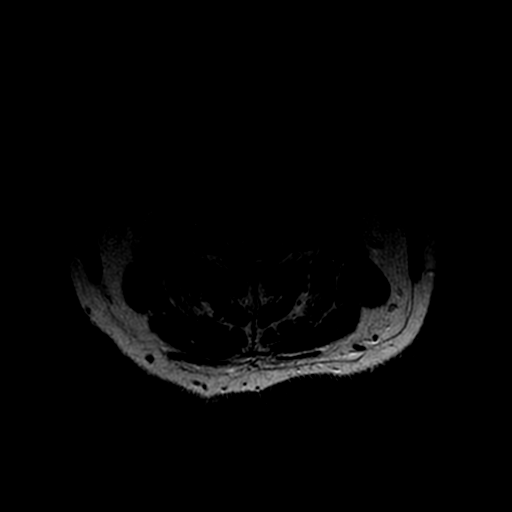
[im 27/36]
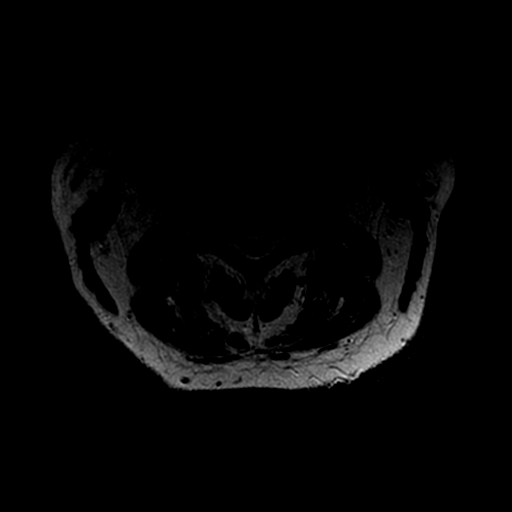
[im 31/36]
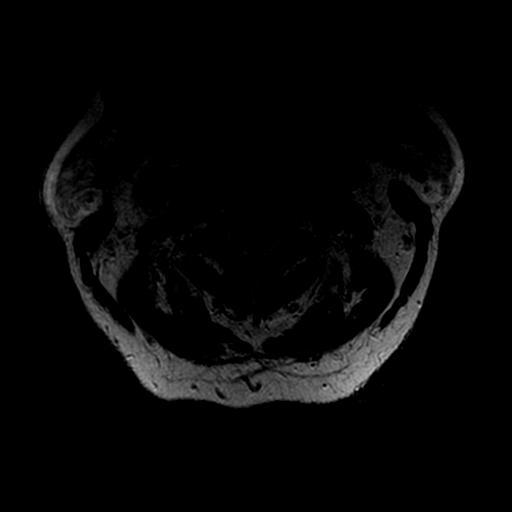
[im 36/36]
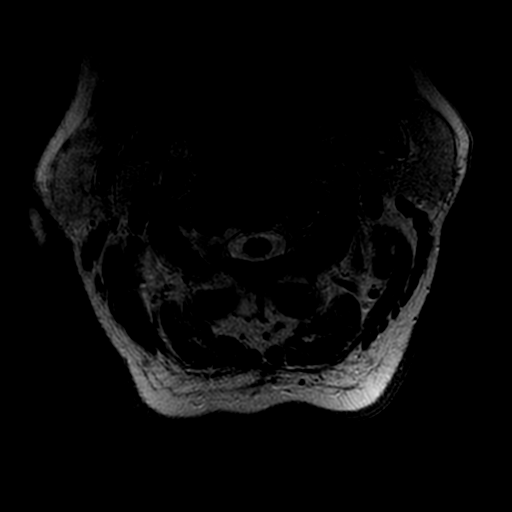

[Series 7: T1 · axial · non-contrast · 3.0mm · 0.39mm/px · z∈[-65,+23]mm · 3 of 36 slices shown (2 of 2)]
[im 5/36]
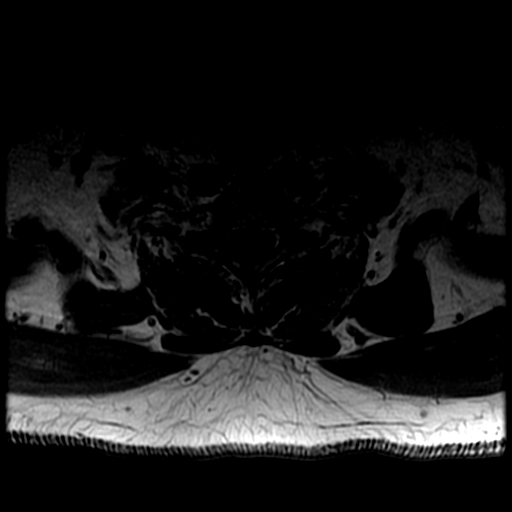
[im 18/36]
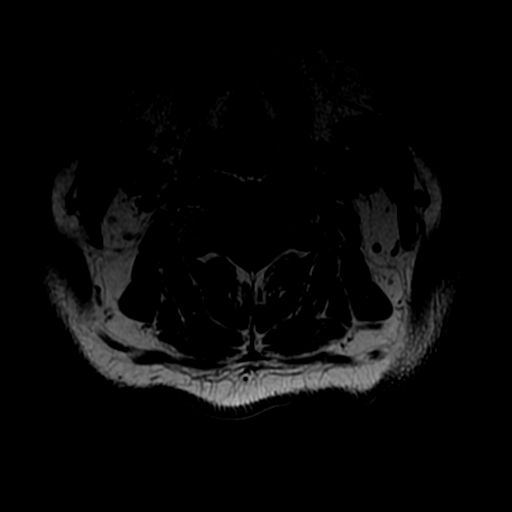
[im 31/36]
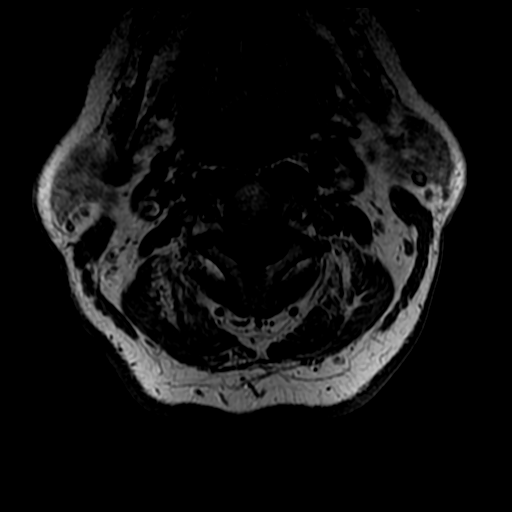

[18 of 48 positions shown; findings below may reference images not displayed]

FINDINGS: Alignment: Slight retrolisthesis is present at L5-S1. There is
straightening of the normal lordosis. AP alignment is otherwise
anatomic.

Vertebrae: Edematous endplate marrow changes are present at L5-S1.
Marrow signal and vertebral body heights are otherwise normal.

Cord: Focal T2 hyperintensity is present within the central cord at
C5-6, associated with severe central canal stenosis.

Posterior Fossa, vertebral arteries, paraspinal tissues: The
craniocervical junction is normal. Flow is present in the vertebral
arteries bilaterally. The visualized intracranial contents are
normal.

Disc levels:

C2-3: Asymmetric uncovertebral spurring on the right contributes to
mild right foraminal narrowing.

C3-4: Uncovertebral and facet disease is present bilaterally. Mild
foraminal narrowing is present bilaterally.

C4-5: Uncovertebral and facet hypertrophy is worse on the right.
This results in moderate right foraminal narrowing.

C5-6: A large soft disc protrusion is present. Susceptibility around
the disc suggests some element of hemorrhage is well. Canal is
narrowed to less than 5 mm. Severe central and bilateral foraminal
narrowing is present, right greater than left.

C6-7: A portion of the soft disc protrusion extends inferiorly to
the C6-7 level. Central canal narrowing extends to the inferior
endplate of C6. The foramina are patent at the disc level.

C7-T1: Negative.
IMPRESSION: 1. Large soft disc extrusion at C5-6 with both superior and inferior
extension resulting in severe central canal stenosis and edema
within the spinal cord.
2. Severe foraminal narrowing bilaterally at C5-6.
3. Central canal stenosis extends inferiorly to the C6-7 disc level.
4. Uncovertebral and facet disease leads to right foraminal stenosis
at C2-3, C3-4, and C4-5. C4-5 is the worst level.

## 2019-11-22 ENCOUNTER — Other Ambulatory Visit: Payer: Self-pay | Admitting: Physician Assistant

## 2019-11-22 ENCOUNTER — Ambulatory Visit: Payer: Self-pay | Attending: Internal Medicine | Admitting: Physician Assistant

## 2019-11-22 ENCOUNTER — Other Ambulatory Visit: Payer: Self-pay

## 2019-11-22 VITALS — BP 153/98 | HR 97 | Ht 71.0 in | Wt 240.8 lb

## 2019-11-22 DIAGNOSIS — N401 Enlarged prostate with lower urinary tract symptoms: Secondary | ICD-10-CM

## 2019-11-22 DIAGNOSIS — E7800 Pure hypercholesterolemia, unspecified: Secondary | ICD-10-CM

## 2019-11-22 DIAGNOSIS — N529 Male erectile dysfunction, unspecified: Secondary | ICD-10-CM

## 2019-11-22 DIAGNOSIS — E118 Type 2 diabetes mellitus with unspecified complications: Secondary | ICD-10-CM

## 2019-11-22 DIAGNOSIS — F1910 Other psychoactive substance abuse, uncomplicated: Secondary | ICD-10-CM

## 2019-11-22 DIAGNOSIS — R350 Frequency of micturition: Secondary | ICD-10-CM

## 2019-11-22 DIAGNOSIS — E1165 Type 2 diabetes mellitus with hyperglycemia: Secondary | ICD-10-CM

## 2019-11-22 DIAGNOSIS — I10 Essential (primary) hypertension: Secondary | ICD-10-CM

## 2019-11-22 DIAGNOSIS — M5412 Radiculopathy, cervical region: Secondary | ICD-10-CM

## 2019-11-22 LAB — POCT GLYCOSYLATED HEMOGLOBIN (HGB A1C): HbA1c, POC (controlled diabetic range): 8.7 % — AB (ref 0.0–7.0)

## 2019-11-22 LAB — GLUCOSE, POCT (MANUAL RESULT ENTRY): POC Glucose: 312 mg/dl — AB (ref 70–99)

## 2019-11-22 MED ORDER — SITAGLIPTIN PHOSPHATE 100 MG PO TABS: 100.0000 mg | ORAL_TABLET | Freq: Every day | ORAL | 1 refills | Status: DC

## 2019-11-22 MED ORDER — HYDROCHLOROTHIAZIDE 12.5 MG PO CAPS: 12.5000 mg | ORAL_CAPSULE | Freq: Every day | ORAL | 5 refills | Status: DC

## 2019-11-22 MED ORDER — GLIMEPIRIDE 4 MG PO TABS: 4.0000 mg | ORAL_TABLET | Freq: Every day | ORAL | 3 refills | Status: DC

## 2019-11-22 MED ORDER — AMLODIPINE BESYLATE 10 MG PO TABS: 10.0000 mg | ORAL_TABLET | Freq: Every day | ORAL | 5 refills | Status: DC

## 2019-11-22 MED ORDER — TAMSULOSIN HCL 0.4 MG PO CAPS: 0.4000 mg | ORAL_CAPSULE | Freq: Every day | ORAL | 3 refills | Status: DC

## 2019-11-22 MED ORDER — METFORMIN HCL 1000 MG PO TABS: 1000.0000 mg | ORAL_TABLET | Freq: Two times a day (BID) | ORAL | 3 refills | Status: DC

## 2019-11-22 MED ORDER — ATORVASTATIN CALCIUM 10 MG PO TABS: 10.0000 mg | ORAL_TABLET | Freq: Every day | ORAL | 5 refills | Status: DC

## 2019-11-22 MED ORDER — GABAPENTIN 300 MG PO CAPS: 300.0000 mg | ORAL_CAPSULE | Freq: Two times a day (BID) | ORAL | 5 refills | Status: DC

## 2019-11-22 MED FILL — AMLODIPINE BESYLATE 10 MG T: 10 | 30 days supply | Qty: 30 | Fill #0

## 2019-11-22 MED FILL — GABAPENTIN 300 MG CAPSULE: 300 | 30 days supply | Qty: 60 | Fill #0

## 2019-11-22 MED FILL — ATORVASTATIN 10 MG TABLET: 10 | 30 days supply | Qty: 30 | Fill #0

## 2019-11-22 MED FILL — HYDROCHLOROTHIAZIDE 12.5 MG: 12.5 | 30 days supply | Qty: 30 | Fill #0

## 2019-11-22 MED FILL — GLIMEPIRIDE 4 MG TABS: 4 | 30 days supply | Qty: 30 | Fill #0

## 2019-11-22 MED FILL — TAMSULOSIN HCL 0.4 MG CAP: 0.4 | 30 days supply | Qty: 30 | Fill #0

## 2019-11-22 MED FILL — JANUVIA 100 MG TABLET: 100 | 30 days supply | Qty: 30 | Fill #0

## 2019-11-22 MED FILL — metFORMIN HCL 1000 MG TABS: 1000 | 30 days supply | Qty: 60 | Fill #0

## 2019-11-22 NOTE — Progress Notes (Signed)
Antonio Pena, is a 54 y.o. male  HQI:[id number]  XLK:[id number]  DOB - [birth year]  Subjective:  Chief Complaint and HPI: Antonio Pena is a 54 y.o. male here today for med RF and requesting viagra.  He does not check his blood sugars.  He has been out of meds for about 1 week.  He is down from smoking 4 to 2 packs of cigs daily.  He is using about $100 of cocaine or crack at least once a week.  Poor lifestyle.  Works 12 hour days.  Eats poor diet  ROS:   Constitutional:  No f/c, No night sweats, No unexplained weight loss. EENT:  No vision changes, No blurry vision, No hearing changes. No mouth, throat, or ear problems.  Respiratory: No cough, No SOB Cardiac: No CP, no palpitations GI:  No abd pain, No N/V/D. GU: frequent urination but out of flomax and blood sugars high Musculoskeletal: No joint pain Neuro: No headache, no dizziness, no motor weakness.  Skin: No rash Endocrine:  No polydipsia. No polyuria.  Psych: Denies SI/HI  No problems updated.  ALLERGIES: Allergies  Allergen Reactions  . Amoxicillin   . Lisinopril     hyperkalemia    PAST MEDICAL HISTORY: Past Medical History:  Diagnosis Date  . Diabetes mellitus without complication ([place])   . Hypertension     MEDICATIONS AT HOME: Prior to Admission medications   Medication Sig Start Date End Date Taking? Authorizing Provider  amLODipine (NORVASC) 10 MG tablet Take 1 tablet (10 mg total) by mouth daily. 11/22/19  Yes Freeman Caldron M, PA-C  atorvastatin (LIPITOR) 10 MG tablet Take 1 tablet (10 mg total) by mouth daily. 11/22/19  Yes Freeman Caldron M, PA-C  diclofenac Sodium (VOLTAREN) 1 % GEL Apply 2 g topically 4 (four) times daily. Must have office visit for refills 10/19/19  Yes Ladell Pier, MD  DULoxetine (CYMBALTA) 30 MG capsule Take 1 capsule (30 mg total) by mouth daily. 04/27/19  Yes Ladell Pier, MD  gabapentin (NEURONTIN) 300 MG capsule Take 1 capsule (300 mg total) by mouth 2 (two)  times daily. TAKE 1 CAPSULE BY MOUTH ONCE DAILY AT BEDTIME FOR ONE WEEK, THEN TAKE TWICE DAILY 11/22/19  Yes McClung, Angela M, PA-C  glimepiride (AMARYL) 4 MG tablet Take 1 tablet (4 mg total) by mouth daily before breakfast. 11/22/19  Yes McClung, Angela M, PA-C  hydrochlorothiazide (MICROZIDE) 12.5 MG capsule Take 1 capsule (12.5 mg total) by mouth daily. 11/22/19  Yes Freeman Caldron M, PA-C  metFORMIN (GLUCOPHAGE) 1000 MG tablet Take 1 tablet (1,000 mg total) by mouth 2 (two) times daily with a meal. 11/22/19  Yes McClung, Angela M, PA-C  tamsulosin (FLOMAX) 0.4 MG CAPS capsule Take 1 capsule (0.4 mg total) by mouth at bedtime. 11/22/19  Yes Argentina Donovan, PA-C  vitamin B-12 (CYANOCOBALAMIN) 1000 MCG tablet Take 1 tablet (1,000 mcg total) by mouth daily. 04/16/17  Yes Ladell Pier, MD  sitaGLIPtin (JANUVIA) 100 MG tablet Take 1 tablet (100 mg total) by mouth daily. 11/22/19   Argentina Donovan, PA-C     Objective:  EXAM:   Vitals:   11/22/19 1128  BP: (!) 153/98  Pulse: 97  SpO2: 100%  Weight: 240 lb 12.8 oz (109.2 kg)  Height: 5\' 11"  (1.803 m)    General appearance : A&OX3. NAD. Non-toxic-appearing HEENT: Atraumatic and Normocephalic.  PERRLA. EOM intact.  Chest/Lungs:  Breathing-non-labored, Good air entry bilaterally, breath sounds normal without rales, rhonchi, or  wheezing  CVS: S1 S2 regular, no murmurs, gallops, rubs  Extremities: Bilateral Lower Ext shows no edema, both legs are warm to touch with = pulse throughout Neurology:  CN II-XII grossly intact, Non focal.   Psych:  TP linear. J/I poor. pressuredspeech. Appropriate eye contact and excited affect.  Skin:  No Rash  Data Review Lab Results  Component Value Date   HGBA1C 8.7 (A) 11/22/2019   HGBA1C 7.7 (A) 04/27/2019   HGBA1C 9.6 (A) 08/09/2018     Assessment & Plan   1. Type 2 diabetes mellitus with complication, without long-term current use of insulin (HCC) Uncontrolled/worsening-will increase  januvia-see #4.  Diabetic diet, checking sugars discussed at length.  Doubt he will do.   - Glucose (CBG) - HgB A1c - Comprehensive metabolic panel - CBC with Differential/Platelet  2. Essential hypertension Uncontrolled.  Off meds.  Take meds.  Avoid cocaine, crack, alcohol, and other drugs.   - amLODipine (NORVASC) 10 MG tablet; Take 1 tablet (10 mg total) by mouth daily.  Dispense: 90 tablet; Refill: 5 - hydrochlorothiazide (MICROZIDE) 12.5 MG capsule; Take 1 capsule (12.5 mg total) by mouth daily.  Dispense: 30 capsule; Refill: 5 - Comprehensive metabolic panel - CBC with Differential/Platelet  3. Pure hypercholesterolemia - atorvastatin (LIPITOR) 10 MG tablet; Take 1 tablet (10 mg total) by mouth daily.  Dispense: 30 tablet; Refill: 5 - Lipid panel - CBC with Differential/Platelet  4. Type 2 diabetes mellitus with hyperglycemia, unspecified whether long term insulin use (HCC) Uncontrolled-see #1 - metFORMIN (GLUCOPHAGE) 1000 MG tablet; Take 1 tablet (1,000 mg total) by mouth 2 (two) times daily with a meal.  Dispense: 180 tablet; Refill: 3 - glimepiride (AMARYL) 4 MG tablet; Take 1 tablet (4 mg total) by mouth daily before breakfast.  Dispense: 90 tablet; Refill: 3 - sitaGLIPtin (JANUVIA) 100 MG tablet; Take 1 tablet (100 mg total) by mouth daily.  Dispense: 90 tablet; Refill: 1  5. Cervical radiculopathy - gabapentin (NEURONTIN) 300 MG capsule; Take 1 capsule (300 mg total) by mouth 2 (two) times daily. TAKE 1 CAPSULE BY MOUTH ONCE DAILY AT BEDTIME FOR ONE WEEK, THEN TAKE TWICE DAILY  Dispense: 60 capsule; Refill: 5  6. Benign prostatic hyperplasia with urinary frequency - tamsulosin (FLOMAX) 0.4 MG CAPS capsule; Take 1 capsule (0.4 mg total) by mouth at bedtime.  Dispense: 90 capsule; Refill: 3  7. Substance abuse (HCC) I have counseled the patient at length about substance abuse and addiction.  12 step meetings/recovery recommended.  Local 12 step meeting lists were given and  attendance was encouraged.  Patient expresses understanding.    8. Erectile dysfunction, unspecified erectile dysfunction type I gave him a strong NO on this given his comorbidities and drug use.  I advised him not to buy this on the streets  And reviewed complications of such use including death.     Patient have been counseled extensively about nutrition and exercise  Return in about 1 month (around 12/23/2019) for with PCP for chronic conditions.  The patient was given clear instructions to go to ER or return to medical center if symptoms don't improve, worsen or new problems develop. The patient verbalized understanding. The patient was told to call to get lab results if they haven't heard anything in the next week.     Georgian Co, PA-C [facility] [place], [place] [phone]   11/22/2019, 11:53 AMPatient ID: Olney Monier, male   DOB: 10/04/1965, 54 y.o.   MRN: [id number]

## 2019-11-22 NOTE — Patient Instructions (Signed)
[email or web address] is alcoholics anonymous website  [email or web address] is NA website  Check your blood sugars and blood pressures at least twice a day

## 2019-11-22 NOTE — Progress Notes (Signed)
Patient is requesting script for Viagra.

## 2019-11-23 LAB — CBC WITH DIFFERENTIAL/PLATELET
Basophils Absolute: 0.1 10*3/uL (ref 0.0–0.2)
Basos: 1 %
EOS (ABSOLUTE): 0.2 10*3/uL (ref 0.0–0.4)
Eos: 3 %
Hematocrit: 51.8 % — ABNORMAL HIGH (ref 37.5–51.0)
Hemoglobin: 17.2 g/dL (ref 13.0–17.7)
Immature Grans (Abs): 0 10*3/uL (ref 0.0–0.1)
Immature Granulocytes: 0 %
Lymphocytes Absolute: 2.1 10*3/uL (ref 0.7–3.1)
Lymphs: 35 %
MCH: 30.7 pg (ref 26.6–33.0)
MCHC: 33.2 g/dL (ref 31.5–35.7)
MCV: 92 fL (ref 79–97)
Monocytes Absolute: 0.5 10*3/uL (ref 0.1–0.9)
Monocytes: 8 %
Neutrophils Absolute: 3.1 10*3/uL (ref 1.4–7.0)
Neutrophils: 53 %
Platelets: 268 10*3/uL (ref 150–450)
RBC: 5.61 x10E6/uL (ref 4.14–5.80)
RDW: 12.4 % (ref 11.6–15.4)
WBC: 5.9 10*3/uL (ref 3.4–10.8)

## 2019-11-23 LAB — COMPREHENSIVE METABOLIC PANEL
ALT: 33 IU/L (ref 0–44)
AST: 14 IU/L (ref 0–40)
Albumin/Globulin Ratio: 2.1 (ref 1.2–2.2)
Albumin: 4.5 g/dL (ref 3.8–4.9)
Alkaline Phosphatase: 153 IU/L — ABNORMAL HIGH (ref 48–121)
BUN/Creatinine Ratio: 10 (ref 9–20)
BUN: 13 mg/dL (ref 6–24)
Bilirubin Total: 0.3 mg/dL (ref 0.0–1.2)
CO2: 26 mmol/L (ref 20–29)
Calcium: 9.7 mg/dL (ref 8.7–10.2)
Chloride: 100 mmol/L (ref 96–106)
Creatinine, Ser: 1.26 mg/dL (ref 0.76–1.27)
GFR calc Af Amer: 75 mL/min/{1.73_m2} (ref >59)
GFR calc non Af Amer: 65 mL/min/{1.73_m2} (ref >59)
Globulin, Total: 2.1 g/dL (ref 1.5–4.5)
Glucose: 307 mg/dL — ABNORMAL HIGH (ref 65–99)
Potassium: 5.1 mmol/L (ref 3.5–5.2)
Sodium: 140 mmol/L (ref 134–144)
Total Protein: 6.6 g/dL (ref 6.0–8.5)

## 2019-11-23 LAB — LIPID PANEL
Chol/HDL Ratio: 5.4 ratio — ABNORMAL HIGH (ref 0.0–5.0)
Cholesterol, Total: 151 mg/dL (ref 100–199)
HDL: 28 mg/dL — ABNORMAL LOW (ref >39)
LDL Chol Calc (NIH): 73 mg/dL (ref 0–99)
Triglycerides: 307 mg/dL — ABNORMAL HIGH (ref 0–149)
VLDL Cholesterol Cal: 50 mg/dL — ABNORMAL HIGH (ref 5–40)

## 2020-01-11 MED FILL — HYDROCHLOROTHIAZIDE 12.5 MG: 12.5 | 30 days supply | Qty: 30 | Fill #1

## 2020-01-11 MED FILL — AMLODIPINE BESYLATE 10 MG T: 10 | 30 days supply | Qty: 30 | Fill #1

## 2020-01-11 MED FILL — ATORVASTATIN 10 MG TABLET: 10 | 30 days supply | Qty: 30 | Fill #1

## 2020-01-11 MED FILL — TAMSULOSIN HCL 0.4 MG CAP: 0.4 | 30 days supply | Qty: 30 | Fill #1

## 2020-01-11 MED FILL — GABAPENTIN 300 MG CAPSULE: 300 | 33 days supply | Qty: 60 | Fill #1

## 2020-01-11 MED FILL — JANUVIA 100 MG TABLET: 100 | 30 days supply | Qty: 30 | Fill #1

## 2020-01-11 MED FILL — GLIMEPIRIDE 4 MG TABS: 4 | 30 days supply | Qty: 30 | Fill #1

## 2020-02-21 MED FILL — GLIMEPIRIDE 4 MG TABS: 4 | 30 days supply | Qty: 30 | Fill #2

## 2020-02-21 MED FILL — HYDROCHLOROTHIAZIDE 12.5 MG: 12.5 | 30 days supply | Qty: 30 | Fill #2

## 2020-02-21 MED FILL — TAMSULOSIN HCL 0.4 MG CAP: 0.4 | 30 days supply | Qty: 30 | Fill #2

## 2020-02-21 MED FILL — AMLODIPINE BESYLATE 10 MG T: 10 | 30 days supply | Qty: 30 | Fill #2

## 2020-02-21 MED FILL — JANUVIA 100 MG TABLET: 100 | 30 days supply | Qty: 30 | Fill #2

## 2020-02-21 MED FILL — ATORVASTATIN 10 MG TABLET: 10 | 30 days supply | Qty: 30 | Fill #2

## 2020-02-21 MED FILL — GABAPENTIN 300 MG CAPSULE: 300 | 30 days supply | Qty: 60 | Fill #2

## 2020-03-29 MED FILL — AMLODIPINE BESYLATE 10 MG T: 10 | 30 days supply | Qty: 30 | Fill #3

## 2020-03-29 MED FILL — JANUVIA 100 MG TABLET: 100 | 30 days supply | Qty: 30 | Fill #3

## 2020-03-29 MED FILL — ATORVASTATIN 10 MG TABLET: 10 | 30 days supply | Qty: 30 | Fill #3

## 2020-03-29 MED FILL — HYDROCHLOROTHIAZIDE 12.5 MG: 12.5 | 30 days supply | Qty: 30 | Fill #3

## 2020-03-29 MED FILL — GABAPENTIN 300 MG CAPSULE: 300 | 30 days supply | Qty: 60 | Fill #3

## 2020-03-29 MED FILL — TAMSULOSIN HCL 0.4 MG CAP: 0.4 | 30 days supply | Qty: 30 | Fill #3

## 2020-03-29 MED FILL — GLIMEPIRIDE 4 MG TABS: 4 | 30 days supply | Qty: 30 | Fill #3

## 2020-05-09 ENCOUNTER — Other Ambulatory Visit: Payer: Self-pay | Admitting: Internal Medicine

## 2020-05-09 DIAGNOSIS — M170 Bilateral primary osteoarthritis of knee: Secondary | ICD-10-CM

## 2020-05-09 MED FILL — HYDROCHLOROTHIAZIDE 12.5 MG: 12.5 | 30 days supply | Qty: 30 | Fill #4

## 2020-05-09 MED FILL — DICLOFENAC SODIUM 1% GEL: 1 | 12 days supply | Qty: 100 | Fill #0

## 2020-05-09 MED FILL — GABAPENTIN 300 MG CAPSULE: 300 | 30 days supply | Qty: 60 | Fill #4

## 2020-05-09 MED FILL — AMLODIPINE BESYLATE 10 MG T: 10 | 30 days supply | Qty: 30 | Fill #4

## 2020-05-09 MED FILL — ATORVASTATIN 10 MG TABLET: 10 | 30 days supply | Qty: 30 | Fill #4

## 2020-05-09 MED FILL — GLIMEPIRIDE 4 MG TABS: 4 | 30 days supply | Qty: 30 | Fill #4

## 2020-05-09 MED FILL — JANUVIA 100 MG TABLET: 100 | 30 days supply | Qty: 30 | Fill #4

## 2020-05-09 MED FILL — TAMSULOSIN HCL 0.4 MG CAP: 0.4 | 30 days supply | Qty: 30 | Fill #4

## 2020-06-11 ENCOUNTER — Other Ambulatory Visit: Payer: Self-pay | Admitting: Internal Medicine

## 2020-06-11 DIAGNOSIS — M170 Bilateral primary osteoarthritis of knee: Secondary | ICD-10-CM

## 2020-06-11 MED FILL — AMLODIPINE BESYLATE 10 MG T: 10 | 30 days supply | Qty: 30 | Fill #5

## 2020-06-11 MED FILL — TAMSULOSIN HCL 0.4 MG CAP: 0.4 | 30 days supply | Qty: 30 | Fill #5

## 2020-06-11 MED FILL — GABAPENTIN 300 MG CAPSULE: 300 | 30 days supply | Qty: 60 | Fill #1

## 2020-06-11 MED FILL — GLIMEPIRIDE 4 MG TABS: 4 | 30 days supply | Qty: 30 | Fill #5

## 2020-06-11 MED FILL — ATORVASTATIN 10 MG TABLET: 10 | 30 days supply | Qty: 30 | Fill #5

## 2020-06-11 MED FILL — HYDROCHLOROTHIAZIDE 12.5 MG: 12.5 | 30 days supply | Qty: 30 | Fill #5

## 2020-06-11 MED FILL — JANUVIA 100 MG TABLET: 100 | 30 days supply | Qty: 30 | Fill #5

## 2020-06-12 MED FILL — DICLOFENAC SODIUM 1% GEL: 1 | 12 days supply | Qty: 100 | Fill #0

## 2020-06-25 ENCOUNTER — Other Ambulatory Visit: Payer: Self-pay

## 2020-06-25 ENCOUNTER — Telehealth: Payer: Self-pay | Admitting: Physician Assistant

## 2020-06-25 DIAGNOSIS — J069 Acute upper respiratory infection, unspecified: Secondary | ICD-10-CM

## 2020-06-25 DIAGNOSIS — Z20822 Contact with and (suspected) exposure to covid-19: Secondary | ICD-10-CM
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
## 2020-06-25 NOTE — Patient Instructions (Signed)
We will call you with your test result, however based on your current symptoms, it is wise to assume you have Covid until test results return.  Roney Jaffe, PA-C Physician Assistant [facility] [email or web address]   COVID-19: Quarantine vs. Isolation QUARANTINE keeps someone who was in close contact with someone who has COVID-19 away from others. If you had close contact with a person who has COVID-19  Stay home until 14 days after your last contact.  Check your temperature twice a day and watch for symptoms of COVID-19.  If possible, stay away from people who are at higher-risk for getting very sick from COVID-19. ISOLATION keeps someone who is sick or tested positive for COVID-19 without symptoms away from others, even in their own home. If you are sick and think or know you have COVID-19  Stay home until after ? At least 10 days since symptoms first appeared and ? At least 24 hours with no fever without fever-reducing medication and ? Symptoms have improved If you tested positive for COVID-19 but do not have symptoms  Stay home until after ? 10 days have passed since your positive test If you live with others, stay in a specific "sick room" or area and away from other people or animals, including pets. Use a separate bathroom, if available. [email or web address] 01/23/2019 This information is not intended to replace advice given to you by your health care provider. Make sure you discuss any questions you have with your health care provider. Document Revised: 06/08/2019 Document Reviewed: 06/08/2019 Elsevier Patient Education  2020 [employer].

## 2020-06-26 ENCOUNTER — Telehealth: Payer: Self-pay | Admitting: Internal Medicine

## 2020-06-26 ENCOUNTER — Other Ambulatory Visit: Payer: Self-pay | Admitting: Internal Medicine

## 2020-06-26 ENCOUNTER — Telehealth (HOSPITAL_BASED_OUTPATIENT_CLINIC_OR_DEPARTMENT_OTHER): Payer: Self-pay | Admitting: Internal Medicine

## 2020-06-26 DIAGNOSIS — R350 Frequency of micturition: Secondary | ICD-10-CM

## 2020-06-26 DIAGNOSIS — N401 Enlarged prostate with lower urinary tract symptoms: Secondary | ICD-10-CM

## 2020-06-26 LAB — POCT URINALYSIS DIP (CLINITEK)
Bilirubin, UA: NEGATIVE
Blood, UA: NEGATIVE
Glucose, UA: 500 mg/dL — AB
Ketones, POC UA: NEGATIVE mg/dL
Leukocytes, UA: NEGATIVE
Nitrite, UA: NEGATIVE
POC PROTEIN,UA: NEGATIVE
Spec Grav, UA: 1.02 (ref 1.010–1.025)
Urobilinogen, UA: 0.2 E.U./dL
pH, UA: 7 (ref 5.0–8.0)

## 2020-06-26 MED ORDER — TAMSULOSIN HCL 0.4 MG PO CAPS: 0.4000 mg | ORAL_CAPSULE | Freq: Every day | ORAL | 3 refills | Status: DC

## 2020-06-26 NOTE — Telephone Encounter (Signed)
Contacted pt to go over lab results. Pt states he understands. Pt states he has an appt in January. Pt is requesting some medicine to help with the frequent urination. Pt states he would like to pick up rx tomorrow if possible

## 2020-06-26 NOTE — Telephone Encounter (Signed)
Returned pt call. Pt states he is having some urine frequency. I asked pt if he would be able to come by and provide a urine sample. Pt states yes he will he will come by today.   Pt came by the office to provide a urine sample. Pt states he get up in the middle of the night to urinate and once he does he is going all night.   Pt states he has purchased 2 different urinary medications otc. Pt states neither of them has worked.   Pt asked to be tested for STDs as well

## 2020-06-26 NOTE — Telephone Encounter (Signed)
Copied from CRM [id number]. Topic: General - Other >> Jun 26, 2020 11:56 AM Dalphine Handing A wrote: Patient forgot to mention during last appt that he is urinating frequently and wants to know if Dr. Laural Benes can send in any medication to help with this. Please advise

## 2020-06-26 NOTE — Telephone Encounter (Signed)
RF sent on Flomax.

## 2020-06-26 NOTE — Telephone Encounter (Signed)
Patient stated that he was returning callback in regards to getting covid vaccine and stated that he was already vaccinated and was currently not feeling well and awaiting covid test results.

## 2020-06-27 ENCOUNTER — Telehealth: Payer: Self-pay | Admitting: *Deleted

## 2020-06-27 LAB — URINE CYTOLOGY ANCILLARY ONLY
Chlamydia: NEGATIVE
Comment: NEGATIVE
Comment: NORMAL
Neisseria Gonorrhea: NEGATIVE

## 2020-06-27 LAB — SARS-COV-2, NAA 2 DAY TAT

## 2020-06-27 LAB — NOVEL CORONAVIRUS, NAA: SARS-CoV-2, NAA: NOT DETECTED

## 2020-06-27 NOTE — Telephone Encounter (Signed)
-----   Message from Roney Jaffe, [place] sent at 06/27/2020  9:21 AM EST ----- Please call patient and let him know that his Covid test was negative.

## 2020-06-27 NOTE — Telephone Encounter (Signed)
Patient verified DOB Patient is aware results being negative. No further concerns.

## 2020-07-04 MED FILL — TAMSULOSIN HCL 0.4 MG CAP: 0.4 | 30 days supply | Qty: 30 | Fill #0

## 2020-07-23 ENCOUNTER — Telehealth: Payer: Self-pay | Admitting: Internal Medicine

## 2020-07-23 DIAGNOSIS — E118 Type 2 diabetes mellitus with unspecified complications: Secondary | ICD-10-CM

## 2020-07-23 NOTE — Telephone Encounter (Unsigned)
Copied from CRM [id number]. Topic: General - Other >> Jul 23, 2020  8:35 AM Gwenlyn Fudge wrote: Reason for CRM: Pt called stating that the pills he was prescribed, tamsulosin, is not helping him with waking uo in the middle of the night using the restroom. Pt is requesting to have something else sent in. Please advise.    [facility] [place], [place] - [place] [address] [place] [address] Phone: [phone] Fax: [phone] Hours: Not open 24 hours

## 2020-07-23 NOTE — Telephone Encounter (Signed)
Will forward to pcp

## 2020-07-23 NOTE — Telephone Encounter (Signed)
Contacted pt and went over Dr. Laural Benes message pt states he will come by tomorrow to get blood work. Pt doesn't have any questions or concerns

## 2020-07-24 ENCOUNTER — Ambulatory Visit: Payer: Self-pay | Attending: Internal Medicine

## 2020-07-24 ENCOUNTER — Other Ambulatory Visit: Payer: Self-pay

## 2020-07-24 DIAGNOSIS — E118 Type 2 diabetes mellitus with unspecified complications: Secondary | ICD-10-CM

## 2020-07-25 LAB — HEMOGLOBIN A1C
Est. average glucose Bld gHb Est-mCnc: 335 mg/dL
Hgb A1c MFr Bld: 13.3 % — ABNORMAL HIGH (ref 4.8–5.6)

## 2020-07-26 ENCOUNTER — Telehealth: Payer: Self-pay

## 2020-07-26 NOTE — Telephone Encounter (Signed)
Contacted pt to go over lab results pt is aware and doesn't have any questions or concerns 

## 2020-08-02 ENCOUNTER — Other Ambulatory Visit: Payer: Self-pay

## 2020-08-02 ENCOUNTER — Ambulatory Visit: Payer: Self-pay | Admitting: Internal Medicine

## 2020-08-02 DIAGNOSIS — E1165 Type 2 diabetes mellitus with hyperglycemia: Secondary | ICD-10-CM

## 2020-08-02 DIAGNOSIS — I10 Essential (primary) hypertension: Secondary | ICD-10-CM

## 2020-08-02 DIAGNOSIS — E7800 Pure hypercholesterolemia, unspecified: Secondary | ICD-10-CM

## 2020-08-02 DIAGNOSIS — M5412 Radiculopathy, cervical region: Secondary | ICD-10-CM

## 2020-08-02 MED ORDER — GABAPENTIN 300 MG PO CAPS: 300.0000 mg | ORAL_CAPSULE | Freq: Two times a day (BID) | ORAL | 0 refills | Status: DC

## 2020-08-02 MED ORDER — AMLODIPINE BESYLATE 10 MG PO TABS: 10.0000 mg | ORAL_TABLET | Freq: Every day | ORAL | 0 refills | Status: DC

## 2020-08-02 MED ORDER — HYDROCHLOROTHIAZIDE 12.5 MG PO CAPS: 12.5000 mg | ORAL_CAPSULE | Freq: Every day | ORAL | 0 refills | Status: DC

## 2020-08-02 MED ORDER — SITAGLIPTIN PHOSPHATE 100 MG PO TABS: 100.0000 mg | ORAL_TABLET | Freq: Every day | ORAL | 0 refills | Status: DC

## 2020-08-02 MED ORDER — ATORVASTATIN CALCIUM 10 MG PO TABS: 10.0000 mg | ORAL_TABLET | Freq: Every day | ORAL | 0 refills | Status: DC

## 2020-08-02 MED ORDER — GLIMEPIRIDE 4 MG PO TABS: 4.0000 mg | ORAL_TABLET | Freq: Every day | ORAL | 0 refills | Status: DC

## 2020-08-02 MED ORDER — METFORMIN HCL 1000 MG PO TABS: 1000.0000 mg | ORAL_TABLET | Freq: Two times a day (BID) | ORAL | 0 refills | Status: DC

## 2020-08-02 MED FILL — GABAPENTIN 300 MG CAPSULE: 300 | 33 days supply | Qty: 60 | Fill #0

## 2020-08-02 MED FILL — JANUVIA 100 MG TABLET: 100 | 30 days supply | Qty: 30 | Fill #0

## 2020-08-02 MED FILL — AMLODIPINE BESYLATE 10 MG T: 10 | 30 days supply | Qty: 30 | Fill #0

## 2020-08-02 MED FILL — METFORMIN HCL 1000 MG TABS: 1000 | 30 days supply | Qty: 60 | Fill #0

## 2020-08-02 MED FILL — GLIMEPIRIDE 4 MG TABS: 4 | 30 days supply | Qty: 30 | Fill #0

## 2020-08-02 MED FILL — HYDROCHLOROTHIAZIDE 12.5 MG: 12.5 | 30 days supply | Qty: 30 | Fill #0

## 2020-08-02 MED FILL — ATORVASTATIN 10 MG TABLET: 10 | 30 days supply | Qty: 30 | Fill #0

## 2020-08-05 ENCOUNTER — Ambulatory Visit: Payer: Self-pay | Attending: Internal Medicine | Admitting: Internal Medicine

## 2020-08-05 ENCOUNTER — Other Ambulatory Visit: Payer: Self-pay

## 2020-08-05 ENCOUNTER — Other Ambulatory Visit: Payer: Self-pay | Admitting: Internal Medicine

## 2020-08-05 ENCOUNTER — Encounter: Payer: Self-pay | Admitting: Internal Medicine

## 2020-08-05 VITALS — BP 144/86 | HR 93 | Temp 98.4°F | Resp 16 | Wt 223.2 lb

## 2020-08-05 DIAGNOSIS — F1910 Other psychoactive substance abuse, uncomplicated: Secondary | ICD-10-CM

## 2020-08-05 DIAGNOSIS — R3589 Other polyuria: Secondary | ICD-10-CM

## 2020-08-05 DIAGNOSIS — M170 Bilateral primary osteoarthritis of knee: Secondary | ICD-10-CM

## 2020-08-05 DIAGNOSIS — R634 Abnormal weight loss: Secondary | ICD-10-CM

## 2020-08-05 DIAGNOSIS — Z1159 Encounter for screening for other viral diseases: Secondary | ICD-10-CM

## 2020-08-05 DIAGNOSIS — E1165 Type 2 diabetes mellitus with hyperglycemia: Secondary | ICD-10-CM

## 2020-08-05 DIAGNOSIS — Z23 Encounter for immunization: Secondary | ICD-10-CM

## 2020-08-05 DIAGNOSIS — I10 Essential (primary) hypertension: Secondary | ICD-10-CM

## 2020-08-05 DIAGNOSIS — F17200 Nicotine dependence, unspecified, uncomplicated: Secondary | ICD-10-CM

## 2020-08-05 DIAGNOSIS — R748 Abnormal levels of other serum enzymes: Secondary | ICD-10-CM

## 2020-08-05 LAB — GLUCOSE, POCT (MANUAL RESULT ENTRY): POC Glucose: 499 mg/dl — AB (ref 70–99)

## 2020-08-05 MED ORDER — TRUEPLUS LANCETS 28G MISC: 4 refills | Status: DC

## 2020-08-05 MED ORDER — GLIMEPIRIDE 4 MG PO TABS: 4.0000 mg | ORAL_TABLET | Freq: Two times a day (BID) | ORAL | 6 refills | Status: DC

## 2020-08-05 MED ORDER — TRUE METRIX BLOOD GLUCOSE TEST VI STRP: ORAL_STRIP | 12 refills | Status: DC

## 2020-08-05 MED ORDER — TRUE METRIX METER W/DEVICE KIT: PACK | 0 refills | Status: AC

## 2020-08-05 MED ORDER — DULOXETINE HCL 20 MG PO CPEP: 20.0000 mg | ORAL_CAPSULE | Freq: Every day | ORAL | 3 refills | Status: DC

## 2020-08-05 MED FILL — DULoxetine HCL 20 MG CPEP: 20 | 30 days supply | Qty: 30 | Fill #0

## 2020-08-05 MED FILL — !TRUE METRIX BLOOD GLUCOSE: 1 days supply | Qty: 1 | Fill #0

## 2020-08-05 MED FILL — TRUE METRIX GLUCOSE TEST ST: 25 days supply | Qty: 100 | Fill #0

## 2020-08-05 MED FILL — TRUEplus LANCETS 28G MISC: 25 days supply | Qty: 100 | Fill #0

## 2020-08-05 NOTE — Patient Instructions (Addendum)
You should be taking Metformin twice a day.  We increase the glimepiride to 4 mg twice a day.  I have sent a prescription to our pharmacy for diabetic testing supplies.  Please check your blood sugar at least once a day before breakfast and sometimes before dinner.  Bring your readings with you on follow-up visit in 6 weeks.  Influenza Virus Vaccine injection (Fluarix) What is this medicine? INFLUENZA VIRUS VACCINE (in floo EN zuh VAHY ruhs vak SEEN) helps to reduce the risk of getting influenza also known as the flu. This medicine may be used for other purposes; ask your health care provider or pharmacist if you have questions. COMMON BRAND NAME(S): Fluarix, Fluzone What should I tell my health care provider before I take this medicine? They need to know if you have any of these conditions:  bleeding disorder like hemophilia  fever or infection  Guillain-Barre syndrome or other neurological problems  immune system problems  infection with the human immunodeficiency virus (HIV) or AIDS  low blood platelet counts  multiple sclerosis  an unusual or allergic reaction to influenza virus vaccine, eggs, chicken proteins, latex, gentamicin, other medicines, foods, dyes or preservatives  pregnant or trying to get pregnant  breast-feeding How should I use this medicine? This vaccine is for injection into a muscle. It is given by a health care professional. A copy of Vaccine Information Statements will be given before each vaccination. Read this sheet carefully each time. The sheet may change frequently. Talk to your pediatrician regarding the use of this medicine in children. Special care may be needed. Overdosage: If you think you have taken too much of this medicine contact a poison control center or emergency room at once. NOTE: This medicine is only for you. Do not share this medicine with others. What if I miss a dose? This does not apply. What may interact with this  medicine?  chemotherapy or radiation therapy  medicines that lower your immune system like etanercept, anakinra, infliximab, and adalimumab  medicines that treat or prevent blood clots like warfarin  phenytoin  steroid medicines like prednisone or cortisone  theophylline  vaccines This list may not describe all possible interactions. Give your health care provider a list of all the medicines, herbs, non-prescription drugs, or dietary supplements you use. Also tell them if you smoke, drink alcohol, or use illegal drugs. Some items may interact with your medicine. What should I watch for while using this medicine? Report any side effects that do not go away within 3 days to your doctor or health care professional. Call your health care provider if any unusual symptoms occur within 6 weeks of receiving this vaccine. You may still catch the flu, but the illness is not usually as bad. You cannot get the flu from the vaccine. The vaccine will not protect against colds or other illnesses that may cause fever. The vaccine is needed every year. What side effects may I notice from receiving this medicine? Side effects that you should report to your doctor or health care professional as soon as possible:  allergic reactions like skin rash, itching or hives, swelling of the face, lips, or tongue Side effects that usually do not require medical attention (report to your doctor or health care professional if they continue or are bothersome):  fever  headache  muscle aches and pains  pain, tenderness, redness, or swelling at site where injected  weak or tired This list may not describe all possible side effects. Call your  doctor for medical advice about side effects. You may report side effects to FDA at 1-800-FDA-1088. Where should I keep my medicine? This vaccine is only given in a clinic, pharmacy, doctor's office, or other health care setting and will not be stored at home. NOTE: This sheet  is a summary. It may not cover all possible information. If you have questions about this medicine, talk to your doctor, pharmacist, or health care provider.  2021 Elsevier/Gold Standard (2008-01-18 09:30:40)
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
## 2020-08-06 LAB — CBC
Hematocrit: 53.9 % — ABNORMAL HIGH (ref 37.5–51.0)
Hemoglobin: 18 g/dL — ABNORMAL HIGH (ref 13.0–17.7)
MCH: 29.4 pg (ref 26.6–33.0)
MCHC: 33.4 g/dL (ref 31.5–35.7)
MCV: 88 fL (ref 79–97)
Platelets: 240 10*3/uL (ref 150–450)
RBC: 6.12 x10E6/uL — ABNORMAL HIGH (ref 4.14–5.80)
RDW: 11.9 % (ref 11.6–15.4)
WBC: 6.8 10*3/uL (ref 3.4–10.8)

## 2020-08-06 LAB — COMPREHENSIVE METABOLIC PANEL
ALT: 18 IU/L (ref 0–44)
AST: 8 IU/L (ref 0–40)
Albumin/Globulin Ratio: 1.9 (ref 1.2–2.2)
Albumin: 4.3 g/dL (ref 3.8–4.9)
Alkaline Phosphatase: 157 IU/L — ABNORMAL HIGH (ref 44–121)
BUN/Creatinine Ratio: 17 (ref 9–20)
BUN: 16 mg/dL (ref 6–24)
Bilirubin Total: 0.5 mg/dL (ref 0.0–1.2)
CO2: 24 mmol/L (ref 20–29)
Calcium: 10.1 mg/dL (ref 8.7–10.2)
Chloride: 100 mmol/L (ref 96–106)
Creatinine, Ser: 0.93 mg/dL (ref 0.76–1.27)
GFR calc Af Amer: 107 mL/min/{1.73_m2} (ref >59)
GFR calc non Af Amer: 93 mL/min/{1.73_m2} (ref >59)
Globulin, Total: 2.3 g/dL (ref 1.5–4.5)
Glucose: 420 mg/dL — ABNORMAL HIGH (ref 65–99)
Potassium: 4.9 mmol/L (ref 3.5–5.2)
Sodium: 137 mmol/L (ref 134–144)
Total Protein: 6.6 g/dL (ref 6.0–8.5)

## 2020-08-06 LAB — LIPID PANEL
Chol/HDL Ratio: 4 ratio (ref 0.0–5.0)
Cholesterol, Total: 137 mg/dL (ref 100–199)
HDL: 34 mg/dL — ABNORMAL LOW (ref >39)
LDL Chol Calc (NIH): 80 mg/dL (ref 0–99)
Triglycerides: 130 mg/dL (ref 0–149)
VLDL Cholesterol Cal: 23 mg/dL (ref 5–40)

## 2020-08-06 LAB — HEPATITIS C ANTIBODY: Hep C Virus Ab: <0.1 s/co ratio (ref 0.0–0.9)

## 2020-08-06 LAB — TSH: TSH: 3.1 u[IU]/mL (ref 0.450–4.500)

## 2020-08-07 ENCOUNTER — Telehealth: Payer: Self-pay

## 2020-08-07 NOTE — Addendum Note (Signed)
Addended by: Jonah Blue B on: 08/07/2020 10:57 AM   Modules accepted: Orders

## 2020-08-07 NOTE — Telephone Encounter (Signed)
Contacted pt to go over lab results pt is aware and doesn't have any questions or concerns 

## 2020-08-07 NOTE — Progress Notes (Signed)
Let patient know that his blood cell count is elevated.  I suspect this is due to heavy cigarette smoking.  Encouraged him to cut back.  Kidney function is normal.  He has mild elevation in one of his liver enzymes which is stable compared to when it was last checked in May of last year.  I have asked the lab to do additional testing on the blood to evaluate this elevation further.  LDL cholesterol is 80 with goal being less than 70.  Continue atorvastatin to help lower cholesterol.  Screen for hepatitis C is negative.  Thyroid level is normal.

## 2020-08-11 LAB — MITOCHONDRIAL ANTIBODIES: Mitochondrial Ab: <20 Units (ref 0.0–20.0)

## 2020-08-11 LAB — SPECIMEN STATUS REPORT

## 2020-08-11 LAB — GAMMA GT: GGT: 22 IU/L (ref 0–65)

## 2020-09-23 ENCOUNTER — Other Ambulatory Visit: Payer: Self-pay | Admitting: Internal Medicine

## 2020-09-23 DIAGNOSIS — I10 Essential (primary) hypertension: Secondary | ICD-10-CM

## 2020-09-23 DIAGNOSIS — E7800 Pure hypercholesterolemia, unspecified: Secondary | ICD-10-CM

## 2020-09-23 DIAGNOSIS — E1165 Type 2 diabetes mellitus with hyperglycemia: Secondary | ICD-10-CM

## 2020-09-23 MED FILL — TAMSULOSIN HCL 0.4 MG CAP: 0.4 | 30 days supply | Qty: 30 | Fill #1

## 2020-09-23 MED FILL — GLIMEPIRIDE 4 MG TABS: 4 | 30 days supply | Qty: 60 | Fill #0

## 2020-09-23 MED FILL — METFORMIN HCL 1000 MG TABS: 1000 | 30 days supply | Qty: 60 | Fill #1

## 2020-09-23 MED FILL — AMLODIPINE BESYLATE 10 MG T: 10 | 30 days supply | Qty: 30 | Fill #6

## 2020-09-23 MED FILL — DULoxetine HCL 20 MG CPEP: 20 | 30 days supply | Qty: 30 | Fill #1

## 2020-09-23 MED FILL — GABAPENTIN 300 MG CAPSULE: 300 | 30 days supply | Qty: 60 | Fill #2

## 2020-09-23 NOTE — Telephone Encounter (Signed)
Notes to clinic: medication last filled on 08/02/2020 Patient has appt on 08/05/2020 Review for refill    Requested Prescriptions  Pending Prescriptions Disp Refills   hydrochlorothiazide (MICROZIDE) 12.5 MG capsule [Pharmacy Med Name: HYDROCHLOROTHIAZIDE 12.5 MG 12.5 Capsule] 30 capsule 0    Sig: Take 1 capsule (12.5 mg total) by mouth daily.      Cardiovascular: Diuretics - Thiazide Failed - 09/23/2020 10:18 AM      Failed - Last BP in normal range    BP Readings from Last 1 Encounters:  08/05/20 (!) 144/86          Passed - Ca in normal range and within 360 days    Calcium  Date Value Ref Range Status  08/05/2020 10.1 8.7 - 10.2 mg/dL Final          Passed - Cr in normal range and within 360 days    Creatinine, Ser  Date Value Ref Range Status  08/05/2020 0.93 0.76 - 1.27 mg/dL Final          Passed - K in normal range and within 360 days    Potassium  Date Value Ref Range Status  08/05/2020 4.9 3.5 - 5.2 mmol/L Final          Passed - Na in normal range and within 360 days    Sodium  Date Value Ref Range Status  08/05/2020 137 134 - 144 mmol/L Final          Passed - Valid encounter within last 6 months    Recent Outpatient Visits           1 month ago Type 2 diabetes mellitus with hyperglycemia, without long-term current use of insulin (HCC)   [place] [facility] [place], Gavin Pound B, MD   10 months ago Type 2 diabetes mellitus with complication, without long-term current use of insulin [facility])   [place] [facility] [place], [place], [place]   1 year ago Type 2 diabetes mellitus without complication, without long-term current use of insulin (HCC)   [place] [facility] Jonah Blue B, MD   1 year ago Type 2 diabetes mellitus with complication, without long-term current use of insulin (HCC)   [place] [facility] [place], Gavin Pound B, MD   2 years ago Uncontrolled type 2  diabetes mellitus without complication, without long-term current use of insulin (HCC)   [facility] Marcine Matar, MD       Future Appointments             In 2 weeks Marcine Matar, MD [facility]               JANUVIA 100 MG tablet [Pharmacy Med Name: JANUVIA 100 MG TABLET 100 Tablet] 30 tablet 0    Sig: TAKE 1 TABLET (100 MG TOTAL) BY MOUTH DAILY.      Endocrinology:  Diabetes - DPP-4 Inhibitors Failed - 09/23/2020 10:18 AM      Failed - HBA1C is between 0 and 7.9 and within 180 days    HbA1c, POC (controlled diabetic range)  Date Value Ref Range Status  11/22/2019 8.7 (A) 0.0 - 7.0 % Final   Hgb A1c MFr Bld  Date Value Ref Range Status  07/24/2020 13.3 (H) 4.8 - 5.6 % Final    Comment:             Prediabetes: 5.7 -  6.4          Diabetes: >6.4          Glycemic control for adults with diabetes: <7.0           Passed - Cr in normal range and within 360 days    Creatinine, Ser  Date Value Ref Range Status  08/05/2020 0.93 0.76 - 1.27 mg/dL Final          Passed - Valid encounter within last 6 months    Recent Outpatient Visits           1 month ago Type 2 diabetes mellitus with hyperglycemia, without long-term current use of insulin (HCC)   [place] [facility] [place], Gavin Pound B, MD   10 months ago Type 2 diabetes mellitus with complication, without long-term current use of insulin [facility])   [place] [facility] [place], [place], [place]   1 year ago Type 2 diabetes mellitus without complication, without long-term current use of insulin (HCC)   [place] [facility] Jonah Blue B, [place]   1 year ago Type 2 diabetes mellitus with complication, without long-term current use of insulin (HCC)   [place] [facility] [place], Gavin Pound B, MD   2 years ago Uncontrolled type 2 diabetes mellitus without complication,  without long-term current use of insulin (HCC)   [place] [facility] Marcine Matar, MD       Future Appointments             In 2 weeks Marcine Matar, MD [place] [facility]               atorvastatin (LIPITOR) 10 MG tablet [Pharmacy Med Name: ATORVASTATIN 10 MG TABLET 10 Tablet] 30 tablet 0    Sig: Take 1 tablet (10 mg total) by mouth daily.      Cardiovascular:  Antilipid - Statins Failed - 09/23/2020 10:18 AM      Failed - LDL in normal range and within 360 days    LDL Chol Calc (NIH)  Date Value Ref Range Status  08/05/2020 80 0 - 99 mg/dL Final          Failed - HDL in normal range and within 360 days    HDL  Date Value Ref Range Status  08/05/2020 34 (L) >39 mg/dL Final          Passed - Total Cholesterol in normal range and within 360 days    Cholesterol, Total  Date Value Ref Range Status  08/05/2020 137 100 - 199 mg/dL Final          Passed - Triglycerides in normal range and within 360 days    Triglycerides  Date Value Ref Range Status  08/05/2020 130 0 - 149 mg/dL Final          Passed - Patient is not pregnant      Passed - Valid encounter within last 12 months    Recent Outpatient Visits           1 month ago Type 2 diabetes mellitus with hyperglycemia, without long-term current use of insulin (HCC)   [place] [facility] [place], Gavin Pound B, MD   10 months ago Type 2 diabetes mellitus with complication, without long-term current use of insulin [facility])    [place] [facility] [place], [place], [place]   1 year ago Type 2 diabetes  mellitus without complication, without long-term current use of insulin [facility])   [place] [facility] Jonah Blue B, [place]   1 year ago Type 2 diabetes mellitus with complication, without long-term current use of insulin [facility])   [place] [facility] Jonah Blue B, MD   2 years ago  Uncontrolled type 2 diabetes mellitus without complication, without long-term current use of insulin [facility])   [place] [facility] Marcine Matar, MD       Future Appointments             In 2 weeks Marcine Matar, MD [facility]

## 2020-09-24 ENCOUNTER — Other Ambulatory Visit: Payer: Self-pay | Admitting: Internal Medicine

## 2020-09-24 MED FILL — HYDROCHLOROTHIAZIDE 12.5 MG: 12.5 | 30 days supply | Qty: 30 | Fill #0

## 2020-09-24 MED FILL — ATORVASTATIN 10 MG TABLET: 10 | 30 days supply | Qty: 30 | Fill #0

## 2020-09-24 MED FILL — JANUVIA 100 MG TABLET: 100 | 30 days supply | Qty: 30 | Fill #0

## 2020-10-05 ENCOUNTER — Other Ambulatory Visit: Payer: Self-pay

## 2020-10-08 ENCOUNTER — Encounter: Payer: Self-pay | Admitting: Internal Medicine

## 2020-10-08 ENCOUNTER — Other Ambulatory Visit: Payer: Self-pay

## 2020-10-08 ENCOUNTER — Ambulatory Visit: Payer: Self-pay | Attending: Internal Medicine | Admitting: Internal Medicine

## 2020-10-08 VITALS — BP 154/93 | HR 93 | Resp 16 | Wt 228.6 lb

## 2020-10-08 DIAGNOSIS — I10 Essential (primary) hypertension: Secondary | ICD-10-CM

## 2020-10-08 DIAGNOSIS — E1165 Type 2 diabetes mellitus with hyperglycemia: Secondary | ICD-10-CM

## 2020-10-08 DIAGNOSIS — D751 Secondary polycythemia: Secondary | ICD-10-CM | POA: Insufficient documentation

## 2020-10-08 DIAGNOSIS — M170 Bilateral primary osteoarthritis of knee: Secondary | ICD-10-CM

## 2020-10-08 DIAGNOSIS — R0683 Snoring: Secondary | ICD-10-CM

## 2020-10-08 DIAGNOSIS — Z1211 Encounter for screening for malignant neoplasm of colon: Secondary | ICD-10-CM

## 2020-10-08 DIAGNOSIS — F17200 Nicotine dependence, unspecified, uncomplicated: Secondary | ICD-10-CM

## 2020-10-08 LAB — GLUCOSE, POCT (MANUAL RESULT ENTRY): POC Glucose: 375 mg/dl — AB (ref 70–99)

## 2020-10-08 MED ORDER — GABAPENTIN 300 MG PO CAPS
ORAL_CAPSULE | ORAL | 1 refills | Status: DC
  Filled 2020-10-08: qty 53, 30d supply, fill #0
  Filled 2021-01-07: qty 53, 30d supply, fill #1
  Filled 2021-05-02: qty 53, 30d supply, fill #2
  Filled 2021-06-24: qty 53, 30d supply, fill #3

## 2020-10-08 MED ORDER — SITAGLIPTIN PHOSPHATE 100 MG PO TABS
ORAL_TABLET | Freq: Every day | ORAL | 1 refills | Status: DC
  Filled 2020-10-08: qty 30, 30d supply, fill #0
  Filled 2021-01-07: qty 30, 30d supply, fill #1
  Filled 2021-05-02: qty 30, 30d supply, fill #2
  Filled 2021-06-24: qty 30, 30d supply, fill #3

## 2020-10-08 MED ORDER — METFORMIN HCL 1000 MG PO TABS
ORAL_TABLET | Freq: Two times a day (BID) | ORAL | 1 refills | Status: DC
  Filled 2020-10-08: qty 60, 30d supply, fill #0
  Filled 2021-01-07: qty 60, 30d supply, fill #1
  Filled 2021-05-02: qty 60, 30d supply, fill #2
  Filled 2021-06-24: qty 60, 30d supply, fill #3

## 2020-10-08 MED ORDER — HYDROCHLOROTHIAZIDE 12.5 MG PO CAPS
ORAL_CAPSULE | Freq: Every day | ORAL | 1 refills | Status: DC
  Filled 2020-10-08: qty 30, 30d supply, fill #0
  Filled 2021-01-07: qty 30, 30d supply, fill #1
  Filled 2021-05-02: qty 30, 30d supply, fill #2
  Filled 2021-06-24: qty 30, 30d supply, fill #3

## 2020-10-08 MED ORDER — DULOXETINE HCL 20 MG PO CPEP
ORAL_CAPSULE | Freq: Every day | ORAL | 1 refills | Status: DC
  Filled 2020-10-08: qty 30, 30d supply, fill #0
  Filled 2021-01-07: qty 30, 30d supply, fill #1
  Filled 2021-05-02: qty 30, 30d supply, fill #2
  Filled 2021-06-24: qty 30, 30d supply, fill #3

## 2020-10-08 MED ORDER — ATORVASTATIN CALCIUM 10 MG PO TABS
ORAL_TABLET | Freq: Every day | ORAL | 1 refills | Status: DC
  Filled 2020-10-08: qty 30, 30d supply, fill #0
  Filled 2021-01-07: qty 30, 30d supply, fill #1
  Filled 2021-05-02: qty 30, 30d supply, fill #2
  Filled 2021-06-24: qty 30, 30d supply, fill #3

## 2020-10-08 MED ORDER — AMLODIPINE BESYLATE 10 MG PO TABS
10.0000 mg | ORAL_TABLET | Freq: Every day | ORAL | 1 refills | Status: DC
  Filled 2020-10-08: qty 30, 30d supply, fill #0
  Filled 2021-01-07: qty 30, 30d supply, fill #1
  Filled 2021-05-02: qty 30, 30d supply, fill #2
  Filled 2021-06-24: qty 30, 30d supply, fill #3

## 2020-10-08 MED ORDER — GLIMEPIRIDE 4 MG PO TABS
ORAL_TABLET | ORAL | 1 refills | Status: DC
  Filled 2020-10-08: qty 60, 30d supply, fill #0
  Filled 2021-01-07: qty 60, 30d supply, fill #1
  Filled 2021-05-02: qty 60, 30d supply, fill #2
  Filled 2021-06-24: qty 60, 30d supply, fill #3

## 2020-10-08 MED ORDER — TAMSULOSIN HCL 0.4 MG PO CAPS
ORAL_CAPSULE | ORAL | 1 refills | Status: DC
  Filled 2020-10-08: qty 30, 30d supply, fill #0
  Filled 2021-01-07: qty 30, 30d supply, fill #1
  Filled 2021-05-02: qty 30, 30d supply, fill #2
  Filled 2021-06-24: qty 30, 30d supply, fill #3
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
## 2020-10-09 ENCOUNTER — Other Ambulatory Visit: Payer: Self-pay

## 2020-12-19 ENCOUNTER — Other Ambulatory Visit: Payer: Self-pay

## 2021-01-07 ENCOUNTER — Other Ambulatory Visit: Payer: Self-pay

## 2021-01-17 ENCOUNTER — Other Ambulatory Visit: Payer: Self-pay

## 2021-02-07 ENCOUNTER — Ambulatory Visit: Payer: Self-pay | Admitting: Internal Medicine

## 2021-04-17 ENCOUNTER — Other Ambulatory Visit: Payer: Self-pay

## 2021-05-02 ENCOUNTER — Other Ambulatory Visit: Payer: Self-pay

## 2021-05-05 ENCOUNTER — Encounter: Payer: Self-pay | Admitting: Internal Medicine

## 2021-05-05 ENCOUNTER — Other Ambulatory Visit: Payer: Self-pay

## 2021-05-05 ENCOUNTER — Ambulatory Visit: Payer: Self-pay | Attending: Internal Medicine | Admitting: Internal Medicine

## 2021-05-05 VITALS — BP 152/97 | HR 104 | Resp 16 | Wt 213.0 lb

## 2021-05-05 DIAGNOSIS — Z23 Encounter for immunization: Secondary | ICD-10-CM

## 2021-05-05 DIAGNOSIS — R1013 Epigastric pain: Secondary | ICD-10-CM

## 2021-05-05 DIAGNOSIS — I10 Essential (primary) hypertension: Secondary | ICD-10-CM

## 2021-05-05 DIAGNOSIS — E1165 Type 2 diabetes mellitus with hyperglycemia: Secondary | ICD-10-CM

## 2021-05-05 DIAGNOSIS — H6191 Disorder of right external ear, unspecified: Secondary | ICD-10-CM

## 2021-05-05 LAB — GLUCOSE, POCT (MANUAL RESULT ENTRY): POC Glucose: >600 mg/dl (ref 70–99)

## 2021-05-05 LAB — POCT GLYCOSYLATED HEMOGLOBIN (HGB A1C): HbA1c, POC (controlled diabetic range): 14.6 % — AB (ref 0.0–7.0)

## 2021-05-05 MED ORDER — INSULIN ASPART 100 UNIT/ML IJ SOLN: 15.0000 [IU] | Freq: Once | INTRAMUSCULAR | Status: AC
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
## 2021-05-06 ENCOUNTER — Telehealth: Payer: Self-pay

## 2021-05-06 LAB — COMPREHENSIVE METABOLIC PANEL
ALT: 21 IU/L (ref 0–44)
AST: 13 IU/L (ref 0–40)
Albumin/Globulin Ratio: 1.8 (ref 1.2–2.2)
Albumin: 4.5 g/dL (ref 3.8–4.9)
Alkaline Phosphatase: 228 IU/L — ABNORMAL HIGH (ref 44–121)
BUN/Creatinine Ratio: 12 (ref 9–20)
BUN: 12 mg/dL (ref 6–24)
Bilirubin Total: 0.4 mg/dL (ref 0.0–1.2)
CO2: 25 mmol/L (ref 20–29)
Calcium: 9.7 mg/dL (ref 8.7–10.2)
Chloride: 96 mmol/L (ref 96–106)
Creatinine, Ser: 0.99 mg/dL (ref 0.76–1.27)
Globulin, Total: 2.5 g/dL (ref 1.5–4.5)
Glucose: 471 mg/dL — ABNORMAL HIGH (ref 70–99)
Potassium: 4.6 mmol/L (ref 3.5–5.2)
Sodium: 138 mmol/L (ref 134–144)
Total Protein: 7 g/dL (ref 6.0–8.5)
eGFR: 91 mL/min/{1.73_m2} (ref >59)

## 2021-05-06 LAB — MICROALBUMIN / CREATININE URINE RATIO
Creatinine, Urine: 22.5 mg/dL
Microalb/Creat Ratio: <13 mg/g creat (ref 0–29)
Microalbumin, Urine: <3 ug/mL

## 2021-05-06 NOTE — Telephone Encounter (Signed)
Contacted pt to go over lab results pt is aware and doesn't have any questions or concerns 

## 2021-05-06 NOTE — Progress Notes (Signed)
Let patient know that his kidney function is good.  Liver enzymes are good except he still has an elevation of one of his liver function tests which we have been monitoring.

## 2021-05-26 ENCOUNTER — Ambulatory Visit (HOSPITAL_COMMUNITY): Payer: Self-pay

## 2021-06-02 ENCOUNTER — Ambulatory Visit (HOSPITAL_COMMUNITY): Payer: Self-pay

## 2021-06-19 ENCOUNTER — Other Ambulatory Visit: Payer: Self-pay

## 2021-06-24 ENCOUNTER — Other Ambulatory Visit: Payer: Self-pay

## 2021-06-25 ENCOUNTER — Other Ambulatory Visit: Payer: Self-pay

## 2021-07-01 ENCOUNTER — Ambulatory Visit: Payer: Self-pay | Admitting: Internal Medicine

## 2021-07-02 ENCOUNTER — Telehealth: Payer: Self-pay | Admitting: Internal Medicine

## 2021-07-02 NOTE — Patient Outreach (Signed)
Mr. Antonio Pena came to the pharmacy on June 24, 2021 to fill his medications.  The technician at the window asked which medications were needed and he said all.  She asked which ones he needed specifically and he said he has never told anyone else which ones.  She went ahead and filled the prescriptions he had filled in October 2022.  He then asked her did she have a sense of humor.  Once he came back to pick up the prescriptions, he told the tech that he did not like the way she spoke to him earlier.  She told him that she would get a Production designer, theatre/television/film but he told her not to do that.  One of the pharmacist on duty came over to help and then Mr. Antonio Pena told the pharmacist that he needed to teach the tech some f'ing manners and that she called him a liar.  The pharmacist told Mr. Antonio Pena that he did not need to use that language and he had not heard her say those exact words.  The tech was trying to speak with him again and Mr. Antonio Pena laughed and said he didn't speak Spanish.  He also said some other things before walking away and the pharmacist told him it was not necessary. Mr. Antonio Pena left the tech very upset at that point.  He would not come back to apologize.

## 2021-07-07 ENCOUNTER — Other Ambulatory Visit: Payer: Self-pay

## 2021-08-26 ENCOUNTER — Other Ambulatory Visit: Payer: Self-pay

## 2021-08-26 ENCOUNTER — Other Ambulatory Visit: Payer: Self-pay | Admitting: Internal Medicine

## 2021-08-26 DIAGNOSIS — E1165 Type 2 diabetes mellitus with hyperglycemia: Secondary | ICD-10-CM

## 2021-08-26 DIAGNOSIS — M170 Bilateral primary osteoarthritis of knee: Secondary | ICD-10-CM

## 2021-08-26 DIAGNOSIS — I10 Essential (primary) hypertension: Secondary | ICD-10-CM

## 2021-08-26 MED ORDER — SITAGLIPTIN PHOSPHATE 100 MG PO TABS
ORAL_TABLET | Freq: Every day | ORAL | 0 refills | Status: DC
  Filled 2021-08-26: qty 90, fill #0
  Filled 2021-08-28: qty 60, 60d supply, fill #0

## 2021-08-26 MED ORDER — HYDROCHLOROTHIAZIDE 12.5 MG PO CAPS
ORAL_CAPSULE | Freq: Every day | ORAL | 0 refills | Status: AC
  Filled 2021-08-26: qty 90, fill #0
  Filled 2021-08-28: qty 30, 30d supply, fill #0

## 2021-08-26 MED ORDER — GABAPENTIN 300 MG PO CAPS
ORAL_CAPSULE | ORAL | 0 refills | Status: AC
  Filled 2021-08-26: qty 180, fill #0
  Filled 2021-08-28: qty 60, 30d supply, fill #0

## 2021-08-26 MED ORDER — GLIMEPIRIDE 4 MG PO TABS
ORAL_TABLET | ORAL | 0 refills | Status: DC
  Filled 2021-08-26: qty 180, fill #0
  Filled 2021-08-28: qty 60, 30d supply, fill #0

## 2021-08-26 MED ORDER — DULOXETINE HCL 20 MG PO CPEP
ORAL_CAPSULE | Freq: Every day | ORAL | 0 refills | Status: AC
  Filled 2021-08-26: qty 90, fill #0
  Filled 2021-08-28: qty 30, 30d supply, fill #0

## 2021-08-26 MED ORDER — AMLODIPINE BESYLATE 10 MG PO TABS
10.0000 mg | ORAL_TABLET | Freq: Every day | ORAL | 0 refills | Status: AC
  Filled 2021-08-26: qty 90, 90d supply, fill #0
  Filled 2021-08-28: qty 30, 30d supply, fill #0

## 2021-08-26 MED ORDER — METFORMIN HCL 1000 MG PO TABS
ORAL_TABLET | Freq: Two times a day (BID) | ORAL | 0 refills | Status: AC
  Filled 2021-08-26: qty 180, fill #0
  Filled 2021-08-28: qty 60, 30d supply, fill #0

## 2021-08-26 NOTE — Telephone Encounter (Signed)
Medication Refill - Medication:  atorvastatin (LIPITOR) 10 MG tablet  DULoxetine (CYMBALTA) 20 MG capsule  gabapentin (NEURONTIN) 300 MG capsule  glimepiride (AMARYL) 4 MG tablet  hydrochlorothiazide (MICROZIDE) 12.5 MG capsule  metFORMIN (GLUCOPHAGE) 1000 MG tablet  sitaGLIPtin (JANUVIA) 100 MG tablet tamsulosin (FLOMAX) 0.4 MG CAPS capsule  amLODipine (NORVASC) 10 MG tablet   Has the patient contacted their pharmacy? Yes.   Contact PCP  Preferred Pharmacy (with phone number or street name):  [facility] Phone:  [phone]  Fax:  [phone]     Has the patient been seen for an appointment in the last year OR does the patient have an upcoming appointment? Yes.    Agent: Please be advised that RX refills may take up to 3 business days. We ask that you follow-up with your pharmacy.

## 2021-08-26 NOTE — Telephone Encounter (Signed)
Requested medication (s) are due for refill today - yes  Requested medication (s) are on the active medication list -yes  Future visit scheduled -yes  Last refill: 10/04/20  Notes to clinic: Request RF: [place] lab protocol- labs over 1 year- sent for review   Requested Prescriptions  Pending Prescriptions Disp Refills   atorvastatin (LIPITOR) 10 MG tablet 90 tablet 1    Sig: TAKE 1 TABLET (10 MG TOTAL) BY MOUTH DAILY.     Cardiovascular:  Antilipid - Statins Failed - 08/26/2021 12:43 PM      Failed - Lipid Panel in normal range within the last 12 months    Cholesterol, Total  Date Value Ref Range Status  08/05/2020 137 100 - 199 mg/dL Final   LDL Chol Calc (NIH)  Date Value Ref Range Status  08/05/2020 80 0 - 99 mg/dL Final   HDL  Date Value Ref Range Status  08/05/2020 34 (L) >39 mg/dL Final   Triglycerides  Date Value Ref Range Status  08/05/2020 130 0 - 149 mg/dL Final         Passed - Patient is not pregnant      Passed - Valid encounter within last 12 months    Recent Outpatient Visits           3 months ago Type 2 diabetes mellitus with hyperglycemia, without long-term current use of insulin ([place])   [place], Neoma Laming B, MD   10 months ago Type 2 diabetes mellitus with hyperglycemia, without long-term current use of insulin ([place])   [place], Neoma Laming B, MD   1 year ago Type 2 diabetes mellitus with hyperglycemia, without long-term current use of insulin ([place])   [place], Neoma Laming B, MD   1 year ago Type 2 diabetes mellitus with complication, without long-term current use of insulin [facility] [address])   [place], [place], [place]   2 years ago Type 2 diabetes mellitus without complication, without long-term current use of insulin ([place])   [place] Ladell Pier, MD       Future  Appointments             In 2 months Ladell Pier, MD [place]             tamsulosin (FLOMAX) 0.4 MG CAPS capsule 90 capsule 1    Sig: TAKE 1 CAPSULE (0.4 MG TOTAL) BY MOUTH AT BEDTIME.     Urology: Alpha-Adrenergic Blocker Failed - 08/26/2021 12:43 PM      Failed - PSA in normal range and within 360 days    Prostate Specific Ag, Serum  Date Value Ref Range Status  08/09/2018 0.4 0.0 - 4.0 ng/mL Final    Comment:    Roche ECLIA methodology. According to the American Urological Association, Serum PSA should decrease and remain at undetectable levels after radical prostatectomy. The AUA defines biochemical recurrence as an initial PSA value 0.2 ng/mL or greater followed by a subsequent confirmatory PSA value 0.2 ng/mL or greater. Values obtained with different assay methods or kits cannot be used interchangeably. Results cannot be interpreted as absolute evidence of the presence or absence of malignant disease.           Failed - Last BP in normal range    BP Readings from Last 1 Encounters:  05/05/21 (!) 152/97  Passed - Valid encounter within last 12 months    Recent Outpatient Visits           3 months ago Type 2 diabetes mellitus with hyperglycemia, without long-term current use of insulin ([place])   [place], Neoma Laming B, MD   10 months ago Type 2 diabetes mellitus with hyperglycemia, without long-term current use of insulin [facility])   [place], Deborah B, MD   1 year ago Type 2 diabetes mellitus with hyperglycemia, without long-term current use of insulin [facility])   [place], Deborah B, MD   1 year ago Type 2 diabetes mellitus with complication, without long-term current use of insulin [facility])   [place], [place], [place]   2 years ago Type 2 diabetes mellitus without  complication, without long-term current use of insulin [facility])   [place] Ladell Pier, MD       Future Appointments             In 2 months Ladell Pier, MD [place]            Signed Prescriptions Disp Refills   DULoxetine (CYMBALTA) 20 MG capsule 90 capsule 0    Sig: TAKE 1 CAPSULE (20 MG TOTAL) BY MOUTH DAILY.     Psychiatry: Antidepressants - SNRI - duloxetine Failed - 08/26/2021 12:43 PM      Failed - Last BP in normal range    BP Readings from Last 1 Encounters:  05/05/21 (!) 152/97          Passed - Cr in normal range and within 360 days    Creatinine, Ser  Date Value Ref Range Status  05/05/2021 0.99 0.76 - 1.27 mg/dL Final          Passed - eGFR is 30 or above and within 360 days    GFR calc Af Amer  Date Value Ref Range Status  08/05/2020 107 >59 mL/min/1.73 Final    Comment:    **In accordance with recommendations from the NKF-ASN Task force,**   [facility] is in the process of updating its eGFR calculation to the   2021 CKD-EPI creatinine equation that estimates kidney function   without a race variable.    GFR calc non Af Amer  Date Value Ref Range Status  08/05/2020 93 >59 mL/min/1.73 Final   eGFR  Date Value Ref Range Status  05/05/2021 91 >59 mL/min/1.73 Final          Passed - Completed PHQ-2 or PHQ-9 in the last 360 days      Passed - Valid encounter within last 6 months    Recent Outpatient Visits           3 months ago Type 2 diabetes mellitus with hyperglycemia, without long-term current use of insulin ([place])   [place], Neoma Laming B, MD   10 months ago Type 2 diabetes mellitus with hyperglycemia, without long-term current use of insulin [facility])   [place] Karle Plumber B, MD   1 year ago Type 2 diabetes mellitus with hyperglycemia, without long-term current use of insulin [facility])   [place] Karle Plumber B, MD   1 year ago Type 2 diabetes mellitus with complication, without long-term current use of insulin ([place])  [place], [place], [place]   2 years ago Type 2 diabetes mellitus without complication, without long-term current use of insulin [facility])   [place], MD       Future Appointments             In 2 months Ladell Pier, MD [place]             gabapentin (NEURONTIN) 300 MG capsule 180 capsule 0    Sig: TAKE 1 CAPSULE BY MOUTH ONCE DAILY AT BEDTIME FOR ONE WEEK, THEN TAKE TWICE DAILY     Neurology: Anticonvulsants - gabapentin Passed - 08/26/2021 12:43 PM      Passed - Cr in normal range and within 360 days    Creatinine, Ser  Date Value Ref Range Status  05/05/2021 0.99 0.76 - 1.27 mg/dL Final          Passed - Completed PHQ-2 or PHQ-9 in the last 360 days      Passed - Valid encounter within last 12 months    Recent Outpatient Visits           3 months ago Type 2 diabetes mellitus with hyperglycemia, without long-term current use of insulin ([place])   [place], Neoma Laming B, MD   10 months ago Type 2 diabetes mellitus with hyperglycemia, without long-term current use of insulin ([place])   [place], Deborah B, MD   1 year ago Type 2 diabetes mellitus with hyperglycemia, without long-term current use of insulin ([place])   [place], Neoma Laming B, MD   1 year ago Type 2 diabetes mellitus with complication, without long-term current use of insulin [facility])   [place], [place], [place]   2 years ago Type 2 diabetes mellitus without complication, without long-term current use of insulin ([place])   [place] Ladell Pier, MD       Future Appointments             In 2 months Ladell Pier, MD [place]             glimepiride (AMARYL) 4 MG tablet 180 tablet 0    Sig: TAKE 1 TABLET (4 MG TOTAL) BY MOUTH 2 (TWO) TIMES DAILY.     Endocrinology:  Diabetes - Sulfonylureas Failed - 08/26/2021 12:43 PM      Failed - HBA1C is between 0 and 7.9 and within 180 days    HbA1c, POC (controlled diabetic range)  Date Value Ref Range Status  05/05/2021 14.6 (A) 0.0 - 7.0 % Final          Passed - Cr in normal range and within 360 days    Creatinine, Ser  Date Value Ref Range Status  05/05/2021 0.99 0.76 - 1.27 mg/dL Final          Passed - Valid encounter within last 6 months    Recent Outpatient Visits           3 months ago Type 2 diabetes mellitus with hyperglycemia, without long-term current use of insulin ([place])   [place], Neoma Laming B, MD   10 months ago Type 2 diabetes mellitus with hyperglycemia, without long-term current use of insulin ([place])  [place] Karle Plumber B, MD   1 year ago Type 2 diabetes mellitus with hyperglycemia, without long-term current use of insulin [facility])   [place], Deborah B, MD   1 year ago Type 2 diabetes mellitus with complication, without long-term current use of insulin [facility])   [place], [place], [place]   2 years ago Type 2 diabetes mellitus without complication, without long-term current use of insulin [facility])   [place], MD       Future Appointments             In 2 months Ladell Pier, MD [place]             hydrochlorothiazide (MICROZIDE) 12.5 MG capsule 90 capsule 0    Sig: TAKE 1 CAPSULE (12.5 MG TOTAL) BY MOUTH DAILY.     Cardiovascular: Diuretics - Thiazide Failed -  08/26/2021 12:43 PM      Failed - Last BP in normal range    BP Readings from Last 1 Encounters:  05/05/21 (!) 152/97          Passed - Cr in normal range and within 180 days    Creatinine, Ser  Date Value Ref Range Status  05/05/2021 0.99 0.76 - 1.27 mg/dL Final          Passed - K in normal range and within 180 days    Potassium  Date Value Ref Range Status  05/05/2021 4.6 3.5 - 5.2 mmol/L Final          Passed - Na in normal range and within 180 days    Sodium  Date Value Ref Range Status  05/05/2021 138 134 - 144 mmol/L Final          Passed - Valid encounter within last 6 months    Recent Outpatient Visits           3 months ago Type 2 diabetes mellitus with hyperglycemia, without long-term current use of insulin ([place])   [place], Neoma Laming B, MD   10 months ago Type 2 diabetes mellitus with hyperglycemia, without long-term current use of insulin ([place])   [place], Deborah B, MD   1 year ago Type 2 diabetes mellitus with hyperglycemia, without long-term current use of insulin ([place])   [place], Neoma Laming B, MD   1 year ago Type 2 diabetes mellitus with complication, without long-term current use of insulin [facility])   [place], [place], [place]   2 years ago Type 2 diabetes mellitus without complication, without long-term current use of insulin ([place])   [place] Ladell Pier, MD       Future Appointments             In 2 months Ladell Pier, MD [place]             metFORMIN (GLUCOPHAGE) 1000 MG tablet 180 tablet 0    Sig: TAKE 1 TABLET (1,000 MG TOTAL) BY MOUTH 2 (TWO) TIMES DAILY WITH A MEAL.     Endocrinology:  Diabetes - Biguanides Failed - 08/26/2021 12:43 PM      Failed - HBA1C is between 0 and 7.9 and within 180  days     HbA1c, POC (controlled diabetic range)  Date Value Ref Range Status  05/05/2021 14.6 (A) 0.0 - 7.0 % Final          Failed - B12 Level in normal range and within 720 days    Vitamin B-12  Date Value Ref Range Status  03/18/2017 306 232 - 1,245 pg/mL Final          Failed - CBC within normal limits and completed in the last 12 months    WBC  Date Value Ref Range Status  08/05/2020 6.8 3.4 - 10.8 x10E3/uL Final   RBC  Date Value Ref Range Status  08/05/2020 6.12 (H) 4.14 - 5.80 x10E6/uL Final   Hemoglobin  Date Value Ref Range Status  08/05/2020 18.0 (H) 13.0 - 17.7 g/dL Final   Hematocrit  Date Value Ref Range Status  08/05/2020 53.9 (H) 37.5 - 51.0 % Final   MCHC  Date Value Ref Range Status  08/05/2020 33.4 31.5 - 35.7 g/dL Final   [facility]  Date Value Ref Range Status  08/05/2020 29.4 26.6 - 33.0 pg Final   MCV  Date Value Ref Range Status  08/05/2020 88 79 - 97 fL Final   No results found for: PLTCOUNTKUC, LABPLAT, POCPLA RDW  Date Value Ref Range Status  08/05/2020 11.9 11.6 - 15.4 % Final         Passed - Cr in normal range and within 360 days    Creatinine, Ser  Date Value Ref Range Status  05/05/2021 0.99 0.76 - 1.27 mg/dL Final          Passed - eGFR in normal range and within 360 days    GFR calc Af Amer  Date Value Ref Range Status  08/05/2020 107 >59 mL/min/1.73 Final    Comment:    **In accordance with recommendations from the NKF-ASN Task force,**   [facility] is in the process of updating its eGFR calculation to the   2021 CKD-EPI creatinine equation that estimates kidney function   without a race variable.    GFR calc non Af Amer  Date Value Ref Range Status  08/05/2020 93 >59 mL/min/1.73 Final   eGFR  Date Value Ref Range Status  05/05/2021 91 >59 mL/min/1.73 Final          Passed - Valid encounter within last 6 months    Recent Outpatient Visits           3 months ago Type 2 diabetes mellitus with hyperglycemia, without  long-term current use of insulin ([place])   [place], Neoma Laming B, MD   10 months ago Type 2 diabetes mellitus with hyperglycemia, without long-term current use of insulin ([place])   [place], Deborah B, MD   1 year ago Type 2 diabetes mellitus with hyperglycemia, without long-term current use of insulin [facility])   [place], Deborah B, MD   1 year ago Type 2 diabetes mellitus with complication, without long-term current use of insulin [facility])   [place], [place], [place]   2 years ago Type 2 diabetes mellitus without complication, without long-term current use of insulin [facility])   [place], Deborah B, MD       Future Appointments             In 2 months [place] Dalbert Batman, MD [place]  [facility]             sitaGLIPtin (JANUVIA) 100 MG tablet 90 tablet 0    Sig: TAKE 1 TABLET (100 MG TOTAL) BY MOUTH DAILY.     Endocrinology:  Diabetes - DPP-4 Inhibitors Failed - 08/26/2021 12:43 PM      Failed - HBA1C is between 0 and 7.9 and within 180 days    HbA1c, POC (controlled diabetic range)  Date Value Ref Range Status  05/05/2021 14.6 (A) 0.0 - 7.0 % Final          Passed - Cr in normal range and within 360 days    Creatinine, Ser  Date Value Ref Range Status  05/05/2021 0.99 0.76 - 1.27 mg/dL Final          Passed - Valid encounter within last 6 months    Recent Outpatient Visits           3 months ago Type 2 diabetes mellitus with hyperglycemia, without long-term current use of insulin ([place])   [place], Neoma Laming B, MD   10 months ago Type 2 diabetes mellitus with hyperglycemia, without long-term current use of insulin ([place])   [place], Deborah B, MD   1 year ago Type 2 diabetes mellitus  with hyperglycemia, without long-term current use of insulin ([place])   [place], Deborah B, MD   1 year ago Type 2 diabetes mellitus with complication, without long-term current use of insulin [facility])   [place], [place], [place]   2 years ago Type 2 diabetes mellitus without complication, without long-term current use of insulin ([place])   [place], MD       Future Appointments             In 2 months Ladell Pier, MD [place]             amLODipine (NORVASC) 10 MG tablet 90 tablet 0    Sig: Take 1 tablet (10 mg total) by mouth daily.     Cardiovascular: Calcium Channel Blockers 2 Failed - 08/26/2021 12:43 PM      Failed - Last BP in normal range    BP Readings from Last 1 Encounters:  05/05/21 (!) 152/97          Passed - Last Heart Rate in normal range    Pulse Readings from Last 1 Encounters:  05/05/21 (!) 104          Passed - Valid encounter within last 6 months    Recent Outpatient Visits           3 months ago Type 2 diabetes mellitus with hyperglycemia, without long-term current use of insulin ([place])   [place], Neoma Laming B, MD   10 months ago Type 2 diabetes mellitus with hyperglycemia, without long-term current use of insulin ([place])   [place], Deborah B, MD   1 year ago Type 2 diabetes mellitus with hyperglycemia, without long-term current use of insulin [facility])   [place], MD   1 year ago Type 2 diabetes mellitus with complication, without long-term current use of insulin [facility])   [place], [place], [place]  2 years ago Type 2 diabetes mellitus without complication, without long-term current use of insulin ([place])   [place] Ladell Pier, MD       Future Appointments             In 2 months Ladell Pier, MD [place]               Requested Prescriptions  Pending Prescriptions Disp Refills   atorvastatin (LIPITOR) 10 MG tablet 90 tablet 1    Sig: TAKE 1 TABLET (10 MG TOTAL) BY MOUTH DAILY.     Cardiovascular:  Antilipid - Statins Failed - 08/26/2021 12:43 PM      Failed - Lipid Panel in normal range within the last 12 months    Cholesterol, Total  Date Value Ref Range Status  08/05/2020 137 100 - 199 mg/dL Final   LDL Chol Calc (NIH)  Date Value Ref Range Status  08/05/2020 80 0 - 99 mg/dL Final   HDL  Date Value Ref Range Status  08/05/2020 34 (L) >39 mg/dL Final   Triglycerides  Date Value Ref Range Status  08/05/2020 130 0 - 149 mg/dL Final         Passed - Patient is not pregnant      Passed - Valid encounter within last 12 months    Recent Outpatient Visits           3 months ago Type 2 diabetes mellitus with hyperglycemia, without long-term current use of insulin ([place])   [place], Neoma Laming B, MD   10 months ago Type 2 diabetes mellitus with hyperglycemia, without long-term current use of insulin ([place])   [place], Neoma Laming B, MD   1 year ago Type 2 diabetes mellitus with hyperglycemia, without long-term current use of insulin ([place])   [place], Neoma Laming B, MD   1 year ago Type 2 diabetes mellitus with complication, without long-term current use of insulin [facility])   [place], [place], [place]   2 years ago Type 2 diabetes mellitus without complication, without long-term current use of insulin ([place])   [place] Ladell Pier, MD       Future Appointments             In 2 months Ladell Pier, MD [place]             tamsulosin (FLOMAX) 0.4 MG CAPS capsule 90 capsule 1    Sig: TAKE 1 CAPSULE (0.4 MG TOTAL) BY MOUTH AT BEDTIME.     Urology: Alpha-Adrenergic Blocker Failed - 08/26/2021 12:43 PM      Failed - PSA in normal range and within 360 days    Prostate Specific Ag, Serum  Date Value Ref Range Status  08/09/2018 0.4 0.0 - 4.0 ng/mL Final    Comment:    Roche ECLIA methodology. According to the American Urological Association, Serum PSA should decrease and remain at undetectable levels after radical prostatectomy. The AUA defines biochemical recurrence as an initial PSA value 0.2 ng/mL or greater followed by a subsequent confirmatory PSA value 0.2 ng/mL or greater. Values obtained with different assay methods or kits cannot be used interchangeably. Results cannot be interpreted as absolute evidence of the presence or absence of malignant disease.  Failed - Last BP in normal range    BP Readings from Last 1 Encounters:  05/05/21 (!) 152/97          Passed - Valid encounter within last 12 months    Recent Outpatient Visits           3 months ago Type 2 diabetes mellitus with hyperglycemia, without long-term current use of insulin ([place])   [place], Neoma Laming B, MD   10 months ago Type 2 diabetes mellitus with hyperglycemia, without long-term current use of insulin [facility])   [place], Deborah B, MD   1 year ago Type 2 diabetes mellitus with hyperglycemia, without long-term current use of insulin [facility])   [place], Deborah B, MD   1 year ago Type 2 diabetes mellitus with complication, without long-term current use of insulin [facility])   [place], [place], [place]   2 years ago Type 2 diabetes mellitus without complication, without long-term current use of insulin ([place])   [place] Ladell Pier, MD       Future Appointments             In 2 months Ladell Pier, MD [place]            Signed Prescriptions Disp Refills   DULoxetine (CYMBALTA) 20 MG capsule 90 capsule 0    Sig: TAKE 1 CAPSULE (20 MG TOTAL) BY MOUTH DAILY.     Psychiatry: Antidepressants - SNRI - duloxetine Failed - 08/26/2021 12:43 PM      Failed - Last BP in normal range    BP Readings from Last 1 Encounters:  05/05/21 (!) 152/97          Passed - Cr in normal range and within 360 days    Creatinine, Ser  Date Value Ref Range Status  05/05/2021 0.99 0.76 - 1.27 mg/dL Final          Passed - eGFR is 30 or above and within 360 days    GFR calc Af Amer  Date Value Ref Range Status  08/05/2020 107 >59 mL/min/1.73 Final    Comment:    **In accordance with recommendations from the NKF-ASN Task force,**   [facility] is in the process of updating its eGFR calculation to the   2021 CKD-EPI creatinine equation that estimates kidney function   without a race variable.    GFR calc non Af Amer  Date Value Ref Range Status  08/05/2020 93 >59 mL/min/1.73 Final   eGFR  Date Value Ref Range Status  05/05/2021 91 >59 mL/min/1.73 Final          Passed - Completed PHQ-2 or PHQ-9 in the last 360 days      Passed - Valid encounter within last 6 months    Recent Outpatient Visits           3 months ago Type 2 diabetes mellitus with hyperglycemia, without long-term current use of insulin ([place])   [place], Neoma Laming B, MD   10 months ago Type 2 diabetes mellitus with hyperglycemia, without long-term current use of insulin [facility])   [place] Karle Plumber B, MD   1 year ago Type 2 diabetes mellitus with hyperglycemia, without long-term current use of insulin ([place])   [place]  [place], Dalbert Batman, MD   1 year  ago Type 2 diabetes mellitus with complication, without long-term current use of insulin [facility])   [place], [place], [place]   2 years ago Type 2 diabetes mellitus without complication, without long-term current use of insulin ([place])   [place], MD       Future Appointments             In 2 months Ladell Pier, MD [place]             gabapentin (NEURONTIN) 300 MG capsule 180 capsule 0    Sig: TAKE 1 CAPSULE BY MOUTH ONCE DAILY AT BEDTIME FOR ONE WEEK, THEN TAKE TWICE DAILY     Neurology: Anticonvulsants - gabapentin Passed - 08/26/2021 12:43 PM      Passed - Cr in normal range and within 360 days    Creatinine, Ser  Date Value Ref Range Status  05/05/2021 0.99 0.76 - 1.27 mg/dL Final          Passed - Completed PHQ-2 or PHQ-9 in the last 360 days      Passed - Valid encounter within last 12 months    Recent Outpatient Visits           3 months ago Type 2 diabetes mellitus with hyperglycemia, without long-term current use of insulin ([place])   [place], Neoma Laming B, MD   10 months ago Type 2 diabetes mellitus with hyperglycemia, without long-term current use of insulin ([place])   [place], Deborah B, MD   1 year ago Type 2 diabetes mellitus with hyperglycemia, without long-term current use of insulin ([place])   [place], Neoma Laming B, MD   1 year ago Type 2 diabetes mellitus with complication, without long-term current use of insulin [facility])   [place], [place], [place]   2 years ago Type 2 diabetes mellitus without complication, without long-term current use of insulin ([place])   [place] Ladell Pier, MD       Future Appointments             In 2 months  Ladell Pier, MD [place]             glimepiride (AMARYL) 4 MG tablet 180 tablet 0    Sig: TAKE 1 TABLET (4 MG TOTAL) BY MOUTH 2 (TWO) TIMES DAILY.     Endocrinology:  Diabetes - Sulfonylureas Failed - 08/26/2021 12:43 PM      Failed - HBA1C is between 0 and 7.9 and within 180 days    HbA1c, POC (controlled diabetic range)  Date Value Ref Range Status  05/05/2021 14.6 (A) 0.0 - 7.0 % Final          Passed - Cr in normal range and within 360 days    Creatinine, Ser  Date Value Ref Range Status  05/05/2021 0.99 0.76 - 1.27 mg/dL Final          Passed - Valid encounter within last 6 months    Recent Outpatient Visits           3 months ago Type 2 diabetes mellitus with hyperglycemia, without long-term current use of insulin ([place])   [facility]  [place], MD   10 months ago Type 2 diabetes mellitus with hyperglycemia, without long-term current use of insulin [facility])   [place], Deborah B, MD   1 year ago Type 2 diabetes mellitus with hyperglycemia, without long-term current use of insulin [facility])   [place], Deborah B, MD   1 year ago Type 2 diabetes mellitus with complication, without long-term current use of insulin [facility])   [place], [place], [place]   2 years ago Type 2 diabetes mellitus without complication, without long-term current use of insulin [facility])   [place], MD       Future Appointments             In 2 months Ladell Pier, MD [place]             hydrochlorothiazide (MICROZIDE) 12.5 MG capsule 90 capsule 0    Sig: TAKE 1 CAPSULE (12.5 MG TOTAL) BY MOUTH DAILY.     Cardiovascular: Diuretics - Thiazide Failed - 08/26/2021 12:43 PM      Failed - Last BP in normal range     BP Readings from Last 1 Encounters:  05/05/21 (!) 152/97          Passed - Cr in normal range and within 180 days    Creatinine, Ser  Date Value Ref Range Status  05/05/2021 0.99 0.76 - 1.27 mg/dL Final          Passed - K in normal range and within 180 days    Potassium  Date Value Ref Range Status  05/05/2021 4.6 3.5 - 5.2 mmol/L Final          Passed - Na in normal range and within 180 days    Sodium  Date Value Ref Range Status  05/05/2021 138 134 - 144 mmol/L Final          Passed - Valid encounter within last 6 months    Recent Outpatient Visits           3 months ago Type 2 diabetes mellitus with hyperglycemia, without long-term current use of insulin ([place])   [place], Neoma Laming B, MD   10 months ago Type 2 diabetes mellitus with hyperglycemia, without long-term current use of insulin ([place])   [place], Deborah B, MD   1 year ago Type 2 diabetes mellitus with hyperglycemia, without long-term current use of insulin ([place])   [place], Neoma Laming B, MD   1 year ago Type 2 diabetes mellitus with complication, without long-term current use of insulin [facility])   [place], [place], [place]   2 years ago Type 2 diabetes mellitus without complication, without long-term current use of insulin ([place])   [place] Ladell Pier, MD       Future Appointments             In 2 months Ladell Pier, MD [place]             metFORMIN (GLUCOPHAGE) 1000 MG tablet 180 tablet 0    Sig: TAKE 1 TABLET (1,000 MG TOTAL) BY MOUTH 2 (TWO) TIMES DAILY WITH A MEAL.  Endocrinology:  Diabetes - Biguanides Failed - 08/26/2021 12:43 PM      Failed - HBA1C is between 0 and 7.9 and within 180 days    HbA1c, POC (controlled diabetic range)  Date Value Ref Range  Status  05/05/2021 14.6 (A) 0.0 - 7.0 % Final          Failed - B12 Level in normal range and within 720 days    Vitamin B-12  Date Value Ref Range Status  03/18/2017 306 232 - 1,245 pg/mL Final          Failed - CBC within normal limits and completed in the last 12 months    WBC  Date Value Ref Range Status  08/05/2020 6.8 3.4 - 10.8 x10E3/uL Final   RBC  Date Value Ref Range Status  08/05/2020 6.12 (H) 4.14 - 5.80 x10E6/uL Final   Hemoglobin  Date Value Ref Range Status  08/05/2020 18.0 (H) 13.0 - 17.7 g/dL Final   Hematocrit  Date Value Ref Range Status  08/05/2020 53.9 (H) 37.5 - 51.0 % Final   MCHC  Date Value Ref Range Status  08/05/2020 33.4 31.5 - 35.7 g/dL Final   [facility]  Date Value Ref Range Status  08/05/2020 29.4 26.6 - 33.0 pg Final   MCV  Date Value Ref Range Status  08/05/2020 88 79 - 97 fL Final   No results found for: PLTCOUNTKUC, LABPLAT, POCPLA RDW  Date Value Ref Range Status  08/05/2020 11.9 11.6 - 15.4 % Final         Passed - Cr in normal range and within 360 days    Creatinine, Ser  Date Value Ref Range Status  05/05/2021 0.99 0.76 - 1.27 mg/dL Final          Passed - eGFR in normal range and within 360 days    GFR calc Af Amer  Date Value Ref Range Status  08/05/2020 107 >59 mL/min/1.73 Final    Comment:    **In accordance with recommendations from the NKF-ASN Task force,**   [facility] is in the process of updating its eGFR calculation to the   2021 CKD-EPI creatinine equation that estimates kidney function   without a race variable.    GFR calc non Af Amer  Date Value Ref Range Status  08/05/2020 93 >59 mL/min/1.73 Final   eGFR  Date Value Ref Range Status  05/05/2021 91 >59 mL/min/1.73 Final          Passed - Valid encounter within last 6 months    Recent Outpatient Visits           3 months ago Type 2 diabetes mellitus with hyperglycemia, without long-term current use of insulin ([place])   [place], Neoma Laming B, MD   10 months ago Type 2 diabetes mellitus with hyperglycemia, without long-term current use of insulin [facility])   [place], Deborah B, MD   1 year ago Type 2 diabetes mellitus with hyperglycemia, without long-term current use of insulin [facility])   [place], Deborah B, MD   1 year ago Type 2 diabetes mellitus with complication, without long-term current use of insulin [facility])   [place], [place], [place]   2 years ago Type 2 diabetes mellitus without complication, without long-term current use of insulin [facility])   [place] [facility] Ladell Pier, MD  Future Appointments             In 2 months Ladell Pier, MD [place]             sitaGLIPtin (JANUVIA) 100 MG tablet 90 tablet 0    Sig: TAKE 1 TABLET (100 MG TOTAL) BY MOUTH DAILY.     Endocrinology:  Diabetes - DPP-4 Inhibitors Failed - 08/26/2021 12:43 PM      Failed - HBA1C is between 0 and 7.9 and within 180 days    HbA1c, POC (controlled diabetic range)  Date Value Ref Range Status  05/05/2021 14.6 (A) 0.0 - 7.0 % Final          Passed - Cr in normal range and within 360 days    Creatinine, Ser  Date Value Ref Range Status  05/05/2021 0.99 0.76 - 1.27 mg/dL Final          Passed - Valid encounter within last 6 months    Recent Outpatient Visits           3 months ago Type 2 diabetes mellitus with hyperglycemia, without long-term current use of insulin ([place])   [place], Neoma Laming B, MD   10 months ago Type 2 diabetes mellitus with hyperglycemia, without long-term current use of insulin ([place])   [place], Deborah B, MD   1 year ago Type 2 diabetes mellitus with hyperglycemia, without long-term current use of insulin  ([place])   [place], Deborah B, MD   1 year ago Type 2 diabetes mellitus with complication, without long-term current use of insulin [facility])   [place], [place], [place]   2 years ago Type 2 diabetes mellitus without complication, without long-term current use of insulin ([place])   [place], MD       Future Appointments             In 2 months Ladell Pier, MD [place]             amLODipine (NORVASC) 10 MG tablet 90 tablet 0    Sig: Take 1 tablet (10 mg total) by mouth daily.     Cardiovascular: Calcium Channel Blockers 2 Failed - 08/26/2021 12:43 PM      Failed - Last BP in normal range    BP Readings from Last 1 Encounters:  05/05/21 (!) 152/97          Passed - Last Heart Rate in normal range    Pulse Readings from Last 1 Encounters:  05/05/21 (!) 104          Passed - Valid encounter within last 6 months    Recent Outpatient Visits           3 months ago Type 2 diabetes mellitus with hyperglycemia, without long-term current use of insulin ([place])   [place], Neoma Laming B, MD   10 months ago Type 2 diabetes mellitus with hyperglycemia, without long-term current use of insulin [facility])   [place] Karle Plumber B, MD   1 year ago Type 2 diabetes mellitus with hyperglycemia, without long-term current use of insulin [facility])   [place] Ladell Pier, MD   1 year ago Type 2 diabetes mellitus  with complication, without long-term current use of insulin [facility])   [place], [place], [place]   2 years ago Type 2 diabetes mellitus without complication, without long-term current use of insulin [facility])   [place], MD        Future Appointments             In 2 months Wynetta Emery Dalbert Batman, MD [place]

## 2021-08-26 NOTE — Telephone Encounter (Signed)
Patient has follow up appointment 11/06/21- RF for that appointment Requested Prescriptions  Pending Prescriptions Disp Refills   atorvastatin (LIPITOR) 10 MG tablet 90 tablet 1    Sig: TAKE 1 TABLET (10 MG TOTAL) BY MOUTH DAILY.     Cardiovascular:  Antilipid - Statins Failed - 08/26/2021 12:43 PM      Failed - Lipid Panel in normal range within the last 12 months    Cholesterol, Total  Date Value Ref Range Status  08/05/2020 137 100 - 199 mg/dL Final   LDL Chol Calc (NIH)  Date Value Ref Range Status  08/05/2020 80 0 - 99 mg/dL Final   HDL  Date Value Ref Range Status  08/05/2020 34 (L) >39 mg/dL Final   Triglycerides  Date Value Ref Range Status  08/05/2020 130 0 - 149 mg/dL Final         Passed - Patient is not pregnant      Passed - Valid encounter within last 12 months    Recent Outpatient Visits          3 months ago Type 2 diabetes mellitus with hyperglycemia, without long-term current use of insulin ([place])   [place], Neoma Laming B, MD   10 months ago Type 2 diabetes mellitus with hyperglycemia, without long-term current use of insulin ([place])   [place], Neoma Laming B, MD   1 year ago Type 2 diabetes mellitus with hyperglycemia, without long-term current use of insulin ([place])   [place], Neoma Laming B, MD   1 year ago Type 2 diabetes mellitus with complication, without long-term current use of insulin [facility])   [place], [place], [place]   2 years ago Type 2 diabetes mellitus without complication, without long-term current use of insulin ([place])   [place], MD      Future Appointments            In 2 months Ladell Pier, MD [place]            DULoxetine (CYMBALTA) 20 MG capsule 90 capsule 0    Sig: TAKE 1 CAPSULE (20 MG  TOTAL) BY MOUTH DAILY.     Psychiatry: Antidepressants - SNRI - duloxetine Failed - 08/26/2021 12:43 PM      Failed - Last BP in normal range    BP Readings from Last 1 Encounters:  05/05/21 (!) 152/97         Passed - Cr in normal range and within 360 days    Creatinine, Ser  Date Value Ref Range Status  05/05/2021 0.99 0.76 - 1.27 mg/dL Final         Passed - eGFR is 30 or above and within 360 days    GFR calc Af Amer  Date Value Ref Range Status  08/05/2020 107 >59 mL/min/1.73 Final    Comment:    **In accordance with recommendations from the NKF-ASN Task force,**   [facility] is in the process of updating its eGFR calculation to the   2021 CKD-EPI creatinine equation that estimates kidney function   without a race variable.    GFR calc non Af Amer  Date Value Ref Range Status  08/05/2020 93 >59 mL/min/1.73 Final   eGFR  Date Value Ref Range Status  05/05/2021 91 >59 mL/min/1.73 Final  Passed - Completed PHQ-2 or PHQ-9 in the last 360 days      Passed - Valid encounter within last 6 months    Recent Outpatient Visits          3 months ago Type 2 diabetes mellitus with hyperglycemia, without long-term current use of insulin ([place])   [place] Ladell Pier, MD   10 months ago Type 2 diabetes mellitus with hyperglycemia, without long-term current use of insulin ([place])   [place], Deborah B, MD   1 year ago Type 2 diabetes mellitus with hyperglycemia, without long-term current use of insulin ([place])   [place], Deborah B, MD   1 year ago Type 2 diabetes mellitus with complication, without long-term current use of insulin [facility])   [place], [place], [place]   2 years ago Type 2 diabetes mellitus without complication, without long-term current use of insulin ([place])   [place], MD      Future Appointments            In 2 months Ladell Pier, MD [place]            gabapentin (NEURONTIN) 300 MG capsule 180 capsule 0    Sig: TAKE 1 CAPSULE BY MOUTH ONCE DAILY AT BEDTIME FOR ONE WEEK, THEN TAKE TWICE DAILY     Neurology: Anticonvulsants - gabapentin Passed - 08/26/2021 12:43 PM      Passed - Cr in normal range and within 360 days    Creatinine, Ser  Date Value Ref Range Status  05/05/2021 0.99 0.76 - 1.27 mg/dL Final         Passed - Completed PHQ-2 or PHQ-9 in the last 360 days      Passed - Valid encounter within last 12 months    Recent Outpatient Visits          3 months ago Type 2 diabetes mellitus with hyperglycemia, without long-term current use of insulin ([place])   [place], Neoma Laming B, MD   10 months ago Type 2 diabetes mellitus with hyperglycemia, without long-term current use of insulin ([place])   [place], Deborah B, MD   1 year ago Type 2 diabetes mellitus with hyperglycemia, without long-term current use of insulin ([place])   [place], Neoma Laming B, MD   1 year ago Type 2 diabetes mellitus with complication, without long-term current use of insulin [facility])   [place], [place], [place]   2 years ago Type 2 diabetes mellitus without complication, without long-term current use of insulin ([place])   [place] Ladell Pier, MD      Future Appointments            In 2 months Ladell Pier, MD [place]            glimepiride (AMARYL) 4 MG tablet 180 tablet 0    Sig: TAKE 1 TABLET (4 MG TOTAL) BY MOUTH 2 (TWO) TIMES DAILY.     Endocrinology:  Diabetes - Sulfonylureas Failed - 08/26/2021 12:43 PM      Failed - HBA1C is between 0 and 7.9 and within 180 days  HbA1c, POC  (controlled diabetic range)  Date Value Ref Range Status  05/05/2021 14.6 (A) 0.0 - 7.0 % Final         Passed - Cr in normal range and within 360 days    Creatinine, Ser  Date Value Ref Range Status  05/05/2021 0.99 0.76 - 1.27 mg/dL Final         Passed - Valid encounter within last 6 months    Recent Outpatient Visits          3 months ago Type 2 diabetes mellitus with hyperglycemia, without long-term current use of insulin ([place])   [place], Neoma Laming B, MD   10 months ago Type 2 diabetes mellitus with hyperglycemia, without long-term current use of insulin ([place])   [place], Deborah B, MD   1 year ago Type 2 diabetes mellitus with hyperglycemia, without long-term current use of insulin ([place])   [place], Deborah B, MD   1 year ago Type 2 diabetes mellitus with complication, without long-term current use of insulin [facility])   [place], [place], [place]   2 years ago Type 2 diabetes mellitus without complication, without long-term current use of insulin ([place])   [place], MD      Future Appointments            In 2 months Ladell Pier, MD [place]            hydrochlorothiazide (MICROZIDE) 12.5 MG capsule 90 capsule 0    Sig: TAKE 1 CAPSULE (12.5 MG TOTAL) BY MOUTH DAILY.     Cardiovascular: Diuretics - Thiazide Failed - 08/26/2021 12:43 PM      Failed - Last BP in normal range    BP Readings from Last 1 Encounters:  05/05/21 (!) 152/97         Passed - Cr in normal range and within 180 days    Creatinine, Ser  Date Value Ref Range Status  05/05/2021 0.99 0.76 - 1.27 mg/dL Final         Passed - K in normal range and within 180 days    Potassium  Date Value Ref Range Status  05/05/2021 4.6 3.5 - 5.2 mmol/L Final          Passed - Na in normal range and within 180 days    Sodium  Date Value Ref Range Status  05/05/2021 138 134 - 144 mmol/L Final         Passed - Valid encounter within last 6 months    Recent Outpatient Visits          3 months ago Type 2 diabetes mellitus with hyperglycemia, without long-term current use of insulin ([place])   [place], Neoma Laming B, MD   10 months ago Type 2 diabetes mellitus with hyperglycemia, without long-term current use of insulin ([place])   [place], Deborah B, MD   1 year ago Type 2 diabetes mellitus with hyperglycemia, without long-term current use of insulin [facility])   [place], MD   1 year ago Type 2 diabetes mellitus with complication, without long-term current use of insulin [facility])   [place], [place], [place]   2  years ago Type 2 diabetes mellitus without complication, without long-term current use of insulin ([place])   [place] Ladell Pier, MD      Future Appointments            In 2 months Ladell Pier, MD [place]            metFORMIN (GLUCOPHAGE) 1000 MG tablet 180 tablet 0    Sig: TAKE 1 TABLET (1,000 MG TOTAL) BY MOUTH 2 (TWO) TIMES DAILY WITH A MEAL.     Endocrinology:  Diabetes - Biguanides Failed - 08/26/2021 12:43 PM      Failed - HBA1C is between 0 and 7.9 and within 180 days    HbA1c, POC (controlled diabetic range)  Date Value Ref Range Status  05/05/2021 14.6 (A) 0.0 - 7.0 % Final         Failed - B12 Level in normal range and within 720 days    Vitamin B-12  Date Value Ref Range Status  03/18/2017 306 232 - 1,245 pg/mL Final         Failed - CBC within normal limits and completed in the last 12 months    WBC  Date Value Ref Range Status  08/05/2020 6.8 3.4 - 10.8 x10E3/uL Final   RBC  Date  Value Ref Range Status  08/05/2020 6.12 (H) 4.14 - 5.80 x10E6/uL Final   Hemoglobin  Date Value Ref Range Status  08/05/2020 18.0 (H) 13.0 - 17.7 g/dL Final   Hematocrit  Date Value Ref Range Status  08/05/2020 53.9 (H) 37.5 - 51.0 % Final   MCHC  Date Value Ref Range Status  08/05/2020 33.4 31.5 - 35.7 g/dL Final   [facility]  Date Value Ref Range Status  08/05/2020 29.4 26.6 - 33.0 pg Final   MCV  Date Value Ref Range Status  08/05/2020 88 79 - 97 fL Final   No results found for: PLTCOUNTKUC, LABPLAT, POCPLA RDW  Date Value Ref Range Status  08/05/2020 11.9 11.6 - 15.4 % Final         Passed - Cr in normal range and within 360 days    Creatinine, Ser  Date Value Ref Range Status  05/05/2021 0.99 0.76 - 1.27 mg/dL Final         Passed - eGFR in normal range and within 360 days    GFR calc Af Amer  Date Value Ref Range Status  08/05/2020 107 >59 mL/min/1.73 Final    Comment:    **In accordance with recommendations from the NKF-ASN Task force,**   [facility] is in the process of updating its eGFR calculation to the   2021 CKD-EPI creatinine equation that estimates kidney function   without a race variable.    GFR calc non Af Amer  Date Value Ref Range Status  08/05/2020 93 >59 mL/min/1.73 Final   eGFR  Date Value Ref Range Status  05/05/2021 91 >59 mL/min/1.73 Final         Passed - Valid encounter within last 6 months    Recent Outpatient Visits          3 months ago Type 2 diabetes mellitus with hyperglycemia, without long-term current use of insulin [facility])   [place], MD   10 months ago Type 2 diabetes mellitus with hyperglycemia, without long-term current use of insulin [facility])   [place], Neoma Laming B,  MD   1 year ago Type 2 diabetes mellitus with hyperglycemia, without long-term current use of insulin ([place])   [place], Neoma Laming B, MD    1 year ago Type 2 diabetes mellitus with complication, without long-term current use of insulin [facility])   [place], [place], [place]   2 years ago Type 2 diabetes mellitus without complication, without long-term current use of insulin [facility])   [place], MD      Future Appointments            In 2 months Ladell Pier, MD [place]            sitaGLIPtin (JANUVIA) 100 MG tablet 90 tablet 0    Sig: TAKE 1 TABLET (100 MG TOTAL) BY MOUTH DAILY.     Endocrinology:  Diabetes - DPP-4 Inhibitors Failed - 08/26/2021 12:43 PM      Failed - HBA1C is between 0 and 7.9 and within 180 days    HbA1c, POC (controlled diabetic range)  Date Value Ref Range Status  05/05/2021 14.6 (A) 0.0 - 7.0 % Final         Passed - Cr in normal range and within 360 days    Creatinine, Ser  Date Value Ref Range Status  05/05/2021 0.99 0.76 - 1.27 mg/dL Final         Passed - Valid encounter within last 6 months    Recent Outpatient Visits          3 months ago Type 2 diabetes mellitus with hyperglycemia, without long-term current use of insulin ([place])   [place], Neoma Laming B, MD   10 months ago Type 2 diabetes mellitus with hyperglycemia, without long-term current use of insulin ([place])   [place], Deborah B, MD   1 year ago Type 2 diabetes mellitus with hyperglycemia, without long-term current use of insulin ([place])   [place], Deborah B, MD   1 year ago Type 2 diabetes mellitus with complication, without long-term current use of insulin [facility])   [place], [place], [place]   2 years ago Type 2 diabetes mellitus without complication, without long-term current use of insulin ([place])   [place]  Ladell Pier, MD      Future Appointments            In 2 months Ladell Pier, MD [place]            tamsulosin (FLOMAX) 0.4 MG CAPS capsule 90 capsule 1    Sig: TAKE 1 CAPSULE (0.4 MG TOTAL) BY MOUTH AT BEDTIME.     Urology: Alpha-Adrenergic Blocker Failed - 08/26/2021 12:43 PM      Failed - PSA in normal range and within 360 days    Prostate Specific Ag, Serum  Date Value Ref Range Status  08/09/2018 0.4 0.0 - 4.0 ng/mL Final    Comment:    Roche ECLIA methodology. According to the American Urological Association, Serum PSA should decrease and remain at undetectable levels after radical prostatectomy. The AUA defines biochemical recurrence as an initial PSA value 0.2 ng/mL or greater followed by a subsequent confirmatory PSA value 0.2 ng/mL or greater. Values obtained with different assay methods or  kits cannot be used interchangeably. Results cannot be interpreted as absolute evidence of the presence or absence of malignant disease.          Failed - Last BP in normal range    BP Readings from Last 1 Encounters:  05/05/21 (!) 152/97         Passed - Valid encounter within last 12 months    Recent Outpatient Visits          3 months ago Type 2 diabetes mellitus with hyperglycemia, without long-term current use of insulin ([place])   [place], Neoma Laming B, MD   10 months ago Type 2 diabetes mellitus with hyperglycemia, without long-term current use of insulin [facility])   [place], Deborah B, MD   1 year ago Type 2 diabetes mellitus with hyperglycemia, without long-term current use of insulin [facility])   [place], Deborah B, MD   1 year ago Type 2 diabetes mellitus with complication, without long-term current use of insulin [facility])   [place], [place], [place]   2 years ago Type  2 diabetes mellitus without complication, without long-term current use of insulin ([place])   [place], MD      Future Appointments            In 2 months Ladell Pier, MD [place]            amLODipine (NORVASC) 10 MG tablet 90 tablet 0    Sig: Take 1 tablet (10 mg total) by mouth daily.     Cardiovascular: Calcium Channel Blockers 2 Failed - 08/26/2021 12:43 PM      Failed - Last BP in normal range    BP Readings from Last 1 Encounters:  05/05/21 (!) 152/97         Passed - Last Heart Rate in normal range    Pulse Readings from Last 1 Encounters:  05/05/21 (!) 104         Passed - Valid encounter within last 6 months    Recent Outpatient Visits          3 months ago Type 2 diabetes mellitus with hyperglycemia, without long-term current use of insulin ([place])   [place], Neoma Laming B, MD   10 months ago Type 2 diabetes mellitus with hyperglycemia, without long-term current use of insulin ([place])   [place], Deborah B, MD   1 year ago Type 2 diabetes mellitus with hyperglycemia, without long-term current use of insulin ([place])   [place], Deborah B, MD   1 year ago Type 2 diabetes mellitus with complication, without long-term current use of insulin [facility])   [place], [place], [place]   2 years ago Type 2 diabetes mellitus without complication, without long-term current use of insulin [facility])   [place], MD      Future Appointments            In 2 months Wynetta Emery, Dalbert Batman, MD [place]

## 2021-08-27 ENCOUNTER — Ambulatory Visit: Payer: Self-pay | Admitting: *Deleted

## 2021-08-27 ENCOUNTER — Other Ambulatory Visit: Payer: Self-pay

## 2021-08-27 MED ORDER — ATORVASTATIN CALCIUM 10 MG PO TABS
ORAL_TABLET | Freq: Every day | ORAL | 1 refills | Status: AC
  Filled 2021-08-27: qty 30, fill #0
  Filled 2021-08-28: qty 30, 30d supply, fill #0

## 2021-08-27 MED ORDER — TAMSULOSIN HCL 0.4 MG PO CAPS
ORAL_CAPSULE | ORAL | 1 refills | Status: AC
  Filled 2021-08-27: qty 90, fill #0
  Filled 2021-08-28: qty 30, 30d supply, fill #0

## 2021-08-27 NOTE — Telephone Encounter (Addendum)
Scheduled appt for f/u for DM f/u and med. RF.   States he's tough and doesn't feel he needs to go to ED.   Advised to drink water and diet Check blood sugars throughout the day Encouraged to f/u for ED visit if unable to get bloods down, N/V, fatigue or feeling worse.

## 2021-08-27 NOTE — Telephone Encounter (Signed)
Phone call placed to Antonio Pena  I informed him that I was made aware of his elevated blood sugar and current symptoms.  Patient states he ran out of his diabetes medicines about 3 weeks ago.  He has felt tired and has had frequent urination.  Blood sugar today is over 700.  Advised patient to be seen in the emergency room immediately but patient declines stating he is working in [employer].  He states that his girlfriend will pick up his medications today and will bring them for him tomorrow.  Strongly advised that he does not wait as he can experience diabetic coma.  However patient still declines stating that if he feels worse he will go to the emergency room.

## 2021-08-27 NOTE — Addendum Note (Signed)
Addended by: Carilyn Goodpasture on: 08/27/2021 11:16 AM   Modules accepted: Orders

## 2021-08-27 NOTE — Telephone Encounter (Signed)
°  Chief Complaint: BS 732 Symptoms: Frequent urination Frequency: 0530 this AM Pertinent Negatives: Patient denies Rapid resp's. vomiting Disposition: [x] ED /[] Urgent Care (no appt availability in office) / [] Appointment(In office/virtual)/ []  [facility]/ [] Home Care/ [] Refused Recommended Disposition /[] [facility]/ []  Follow-up with PCP Additional Notes: Pt states has not taken any of his meds for 3 weeks, out. Declines ED. Pt is in [place]. States GF will pick up meds when available and deliver them to him in [place]. Reiterated need for ED at this time, declines. "I feel fine." Please advise. CB# [phone]        Reason for Disposition  Blood glucose > 500 mg/dL ( mmol/L)  Answer Assessment - Initial Assessment Questions 1. BLOOD GLUCOSE: "What is your blood glucose level?"      732 2. ONSET: "When did you check the blood glucose?"     0530 3. USUAL RANGE: "What is your glucose level usually?" (e.g., usual fasting morning value, usual evening value)     90-120 4. KETONES: "Do you check for ketones (urine or blood test strips)?" If yes, ask: "What does the test show now?"      no 5. TYPE 1 or 2:  "Do you know what type of diabetes you have?"  (e.g., Type 1, Type 2, Gestational; doesn't know)      2 6. INSULIN: "Do you take insulin?" "What type of insulin(s) do you use? What is the mode of delivery? (syringe, pen; injection or pump)?"      no 7. DIABETES PILLS: "Do you take any pills for your diabetes?" If yes, ask: "Have you missed taking any pills recently?"     "Have been good past 4 months taking pills." 8. OTHER SYMPTOMS: "Do you have any symptoms?" (e.g., fever, frequent urination, difficulty breathing, dizziness, weakness, vomiting)     Frequent urination  Protocols used: Diabetes - High Blood Sugar-A-AH

## 2021-08-27 NOTE — Telephone Encounter (Signed)
Patient called back checking on the status of medication refill request and stated his blood sugar has been 700 because he has been without his medication (reference 08/27/2021 NT notes). Patient would like medication sent to [facility] today and would like a follow up call. Patient stated he is out of town due to work obligations and will follow up with PCP in May but does need a short supply.

## 2021-08-28 ENCOUNTER — Other Ambulatory Visit: Payer: Self-pay

## 2021-08-28 ENCOUNTER — Ambulatory Visit: Payer: Self-pay | Admitting: Internal Medicine

## 2021-08-29 ENCOUNTER — Other Ambulatory Visit: Payer: Self-pay

## 2021-09-29 ENCOUNTER — Ambulatory Visit: Payer: Self-pay | Admitting: Internal Medicine

## 2021-11-06 ENCOUNTER — Ambulatory Visit: Payer: Self-pay | Admitting: Internal Medicine

## 2022-01-12 ENCOUNTER — Other Ambulatory Visit (HOSPITAL_COMMUNITY): Payer: Self-pay

## 2022-02-02 ENCOUNTER — Ambulatory Visit: Payer: Self-pay

## 2022-02-02 ENCOUNTER — Other Ambulatory Visit: Payer: Self-pay

## 2022-02-02 DIAGNOSIS — E1165 Type 2 diabetes mellitus with hyperglycemia: Secondary | ICD-10-CM

## 2022-02-02 MED ORDER — GLIMEPIRIDE 4 MG PO TABS
ORAL_TABLET | ORAL | 0 refills | Status: AC
  Filled 2022-02-02: qty 60, 30d supply, fill #0

## 2022-02-02 MED ORDER — SITAGLIPTIN PHOSPHATE 100 MG PO TABS
ORAL_TABLET | Freq: Every day | ORAL | 0 refills | Status: AC
  Filled 2022-02-02: qty 90, fill #0

## 2022-02-02 NOTE — Addendum Note (Signed)
Addended by: Jonah Blue B on: 02/02/2022 03:09 PM   Modules accepted: Orders

## 2022-02-02 NOTE — Telephone Encounter (Signed)
Patient has canceled or no showed following appointments for 2023- 3/27, 5/4, 8/3  Pls advise on refills and blood sugar

## 2022-02-02 NOTE — Telephone Encounter (Signed)
       Chief Complaint: Blood glucose 719 last night. Has stopped "all my medicines 6 months ago." "I'm in [place] working and I want my medicines refilled to [facility] pharmacy." Symptoms: Feels tired. Frequency: 2 weeks Pertinent Negatives: Patient denies  Disposition: [x] ED /[] Urgent Care (no appt availability in office) / [] Appointment(In office/virtual)/ []  [facility]/ [] Home Care/ [] Refused Recommended Disposition /[] [facility]/ []  Follow-up with PCP Additional Notes: Declines ED. Wants medications refilled so "my girlfriend can pick them up and bring them to me."   Reason for Disposition  [1] Blood glucose > 240 mg/dL ( mmol/L) AND rapid breathing  Answer Assessment - Initial Assessment Questions 1. BLOOD GLUCOSE: "What is your blood glucose level?"      719 2. ONSET: "When did you check the blood glucose?"     Last night 3. USUAL RANGE: "What is your glucose level usually?" (e.g., usual fasting morning value, usual evening value)     200-205 and then 700 at times 4. KETONES: "Do you check for ketones (urine or blood test strips)?" If Yes, ask: "What does the test show now?"      No 5. TYPE 1 or 2:  "Do you know what type of diabetes you have?"  (e.g., Type 1, Type 2, Gestational; doesn't know)      Type 2  6. INSULIN: "Do you take insulin?" "What type of insulin(s) do you use? What is the mode of delivery? (syringe, pen; injection or pump)?"      No 7. DIABETES PILLS: "Do you take any pills for your diabetes?" If Yes, ask: "Have you missed taking any pills recently?"     Yes - stopped medication 6 months ago 8. OTHER SYMPTOMS: "Do you have any symptoms?" (e.g., fever, frequent urination, difficulty breathing, dizziness, weakness, vomiting)     Tired 9. PREGNANCY: "Is there any chance you are pregnant?" "When was your last menstrual period?"     N/a  Protocols used: Diabetes - High Blood Sugar-A-AH

## 2022-02-03 NOTE — Telephone Encounter (Signed)
Left message on voicemail informing patient Rx was sent to pharmacy on yesterday, 02/02/2022. Advised to call office to schedule an apt prior to additional refills.

## 2022-02-04 NOTE — Telephone Encounter (Signed)
Patient states he will be in town.  Scheduled an apt for pt 02/13/2022 at 1450.

## 2022-02-05 ENCOUNTER — Ambulatory Visit: Payer: Self-pay | Admitting: Internal Medicine

## 2022-02-09 ENCOUNTER — Other Ambulatory Visit: Payer: Self-pay

## 2022-02-13 ENCOUNTER — Ambulatory Visit (HOSPITAL_BASED_OUTPATIENT_CLINIC_OR_DEPARTMENT_OTHER): Payer: Self-pay | Admitting: Internal Medicine

## 2022-02-13 DIAGNOSIS — Z5321 Procedure and treatment not carried out due to patient leaving prior to being seen by health care provider: Secondary | ICD-10-CM

## 2022-02-13 NOTE — Progress Notes (Signed)
Patient left before he was called from the lobby.

## 2022-04-28 ENCOUNTER — Ambulatory Visit: Payer: Self-pay | Admitting: Internal Medicine
